# Patient Record
Sex: Female | Born: 1935 | Race: White | Hispanic: No | Smoking: Former smoker
Health system: Southern US, Community
[De-identification: ages and names within clinical notes are randomized; demographics above are authoritative.]

## PROBLEM LIST (undated history)

## (undated) DIAGNOSIS — I10 Essential (primary) hypertension: Secondary | ICD-10-CM

## (undated) DIAGNOSIS — C801 Malignant (primary) neoplasm, unspecified: Secondary | ICD-10-CM

## (undated) DIAGNOSIS — I2510 Atherosclerotic heart disease of native coronary artery without angina pectoris: Secondary | ICD-10-CM

## (undated) DIAGNOSIS — E079 Disorder of thyroid, unspecified: Secondary | ICD-10-CM

## (undated) DIAGNOSIS — J449 Chronic obstructive pulmonary disease, unspecified: Secondary | ICD-10-CM

## (undated) HISTORY — PX: BREAST SURGERY: SHX581

## (undated) HISTORY — PX: CHOLECYSTECTOMY: SHX55

## (undated) HISTORY — PX: MASTECTOMY: SHX3

## (undated) HISTORY — PX: HEMORRHOID SURGERY: SHX153

---

## 2004-10-29 ENCOUNTER — Ambulatory Visit: Payer: Self-pay | Admitting: Internal Medicine

## 2004-11-14 ENCOUNTER — Ambulatory Visit: Payer: Self-pay | Admitting: Internal Medicine

## 2004-11-21 ENCOUNTER — Ambulatory Visit: Payer: Self-pay | Admitting: Internal Medicine

## 2004-11-30 ENCOUNTER — Ambulatory Visit: Payer: Self-pay | Admitting: Internal Medicine

## 2004-12-11 ENCOUNTER — Ambulatory Visit: Payer: Self-pay | Admitting: General Surgery

## 2004-12-13 ENCOUNTER — Ambulatory Visit: Payer: Self-pay | Admitting: General Surgery

## 2004-12-15 ENCOUNTER — Ambulatory Visit: Payer: Self-pay | Admitting: Internal Medicine

## 2005-01-14 ENCOUNTER — Ambulatory Visit: Payer: Self-pay | Admitting: Internal Medicine

## 2005-02-14 ENCOUNTER — Ambulatory Visit: Payer: Self-pay | Admitting: Internal Medicine

## 2005-03-25 ENCOUNTER — Ambulatory Visit: Payer: Self-pay | Admitting: Internal Medicine

## 2005-04-16 ENCOUNTER — Ambulatory Visit: Payer: Self-pay | Admitting: Internal Medicine

## 2005-07-29 ENCOUNTER — Ambulatory Visit: Payer: Self-pay | Admitting: Internal Medicine

## 2005-08-16 ENCOUNTER — Ambulatory Visit: Payer: Self-pay | Admitting: Internal Medicine

## 2005-11-26 ENCOUNTER — Ambulatory Visit: Payer: Self-pay | Admitting: Internal Medicine

## 2005-12-09 ENCOUNTER — Ambulatory Visit: Payer: Self-pay | Admitting: General Surgery

## 2005-12-15 ENCOUNTER — Ambulatory Visit: Payer: Self-pay | Admitting: Internal Medicine

## 2006-06-20 ENCOUNTER — Ambulatory Visit: Payer: Self-pay | Admitting: General Surgery

## 2008-11-22 ENCOUNTER — Inpatient Hospital Stay: Payer: Self-pay | Admitting: Internal Medicine

## 2010-02-14 ENCOUNTER — Ambulatory Visit: Payer: Self-pay | Admitting: Internal Medicine

## 2010-02-28 ENCOUNTER — Ambulatory Visit: Payer: Self-pay | Admitting: Internal Medicine

## 2010-03-16 ENCOUNTER — Ambulatory Visit: Payer: Self-pay | Admitting: Internal Medicine

## 2014-02-01 ENCOUNTER — Emergency Department: Payer: Self-pay | Admitting: Emergency Medicine

## 2014-02-01 LAB — COMPREHENSIVE METABOLIC PANEL
ANION GAP: 5 — AB (ref 7–16)
Albumin: 3.4 g/dL (ref 3.4–5.0)
Alkaline Phosphatase: 68 U/L
BILIRUBIN TOTAL: 0.4 mg/dL (ref 0.2–1.0)
BUN: 28 mg/dL — AB (ref 7–18)
CO2: 28 mmol/L (ref 21–32)
CREATININE: 1.47 mg/dL — AB (ref 0.60–1.30)
Calcium, Total: 9.1 mg/dL (ref 8.5–10.1)
Chloride: 104 mmol/L (ref 98–107)
EGFR (Non-African Amer.): 30 — ABNORMAL LOW
GFR CALC AF AMER: 35 — AB
Glucose: 140 mg/dL — ABNORMAL HIGH (ref 65–99)
Osmolality: 282 (ref 275–301)
POTASSIUM: 3.9 mmol/L (ref 3.5–5.1)
SGOT(AST): 24 U/L (ref 15–37)
SGPT (ALT): 13 U/L (ref 12–78)
Sodium: 137 mmol/L (ref 136–145)
Total Protein: 6.8 g/dL (ref 6.4–8.2)

## 2014-02-01 LAB — CBC WITH DIFFERENTIAL/PLATELET
Basophil #: 0 10*3/uL (ref 0.0–0.1)
Basophil %: 0.5 %
Eosinophil #: 0.2 10*3/uL (ref 0.0–0.7)
Eosinophil %: 2.3 %
HCT: 42.2 % (ref 35.0–47.0)
HGB: 13.8 g/dL (ref 12.0–16.0)
LYMPHS ABS: 1.5 10*3/uL (ref 1.0–3.6)
LYMPHS PCT: 18.1 %
MCH: 31.3 pg (ref 26.0–34.0)
MCHC: 32.6 g/dL (ref 32.0–36.0)
MCV: 96 fL (ref 80–100)
Monocyte #: 1 x10 3/mm — ABNORMAL HIGH (ref 0.2–0.9)
Monocyte %: 11.8 %
NEUTROS ABS: 5.7 10*3/uL (ref 1.4–6.5)
Neutrophil %: 67.3 %
Platelet: 235 10*3/uL (ref 150–440)
RBC: 4.41 10*6/uL (ref 3.80–5.20)
RDW: 13.1 % (ref 11.5–14.5)
WBC: 8.5 10*3/uL (ref 3.6–11.0)

## 2014-02-01 LAB — URINALYSIS, COMPLETE
BACTERIA: NONE SEEN
Bilirubin,UR: NEGATIVE
Blood: NEGATIVE
GLUCOSE, UR: NEGATIVE mg/dL (ref 0–75)
Hyaline Cast: 7
Ketone: NEGATIVE
Nitrite: NEGATIVE
PROTEIN: NEGATIVE
Ph: 5 (ref 4.5–8.0)
RBC,UR: 2 /HPF (ref 0–5)
Specific Gravity: 1.016 (ref 1.003–1.030)
WBC UR: 5 /HPF (ref 0–5)

## 2014-02-01 LAB — TROPONIN I

## 2014-02-01 LAB — PRO B NATRIURETIC PEPTIDE: B-Type Natriuretic Peptide: 1258 pg/mL — ABNORMAL HIGH (ref 0–450)

## 2015-04-23 ENCOUNTER — Encounter: Payer: Self-pay | Admitting: Emergency Medicine

## 2015-04-23 ENCOUNTER — Other Ambulatory Visit: Payer: Self-pay

## 2015-04-23 ENCOUNTER — Emergency Department
Admission: EM | Admit: 2015-04-23 | Discharge: 2015-04-23 | Disposition: A | Discharge status: Skilled Nursing Facility | Payer: Medicare Other | Attending: Emergency Medicine | Admitting: Emergency Medicine

## 2015-04-23 ENCOUNTER — Emergency Department: Payer: Medicare Other

## 2015-04-23 DIAGNOSIS — I10 Essential (primary) hypertension: Secondary | ICD-10-CM | POA: Diagnosis not present

## 2015-04-23 DIAGNOSIS — J441 Chronic obstructive pulmonary disease with (acute) exacerbation: Secondary | ICD-10-CM | POA: Insufficient documentation

## 2015-04-23 DIAGNOSIS — Z87891 Personal history of nicotine dependence: Secondary | ICD-10-CM | POA: Diagnosis not present

## 2015-04-23 DIAGNOSIS — F419 Anxiety disorder, unspecified: Secondary | ICD-10-CM | POA: Diagnosis not present

## 2015-04-23 DIAGNOSIS — R0602 Shortness of breath: Secondary | ICD-10-CM | POA: Diagnosis present

## 2015-04-23 HISTORY — DX: Malignant (primary) neoplasm, unspecified: C80.1

## 2015-04-23 HISTORY — DX: Essential (primary) hypertension: I10

## 2015-04-23 HISTORY — DX: Atherosclerotic heart disease of native coronary artery without angina pectoris: I25.10

## 2015-04-23 HISTORY — DX: Disorder of thyroid, unspecified: E07.9

## 2015-04-23 HISTORY — DX: Chronic obstructive pulmonary disease, unspecified: J44.9

## 2015-04-23 LAB — COMPREHENSIVE METABOLIC PANEL
ALT: 13 U/L — ABNORMAL LOW (ref 14–54)
ANION GAP: 10 (ref 5–15)
AST: 23 U/L (ref 15–41)
Albumin: 4.3 g/dL (ref 3.5–5.0)
Alkaline Phosphatase: 74 U/L (ref 38–126)
BILIRUBIN TOTAL: 0.8 mg/dL (ref 0.3–1.2)
BUN: 38 mg/dL — AB (ref 6–20)
CO2: 30 mmol/L (ref 22–32)
Calcium: 9.7 mg/dL (ref 8.9–10.3)
Chloride: 99 mmol/L — ABNORMAL LOW (ref 101–111)
Creatinine, Ser: 1.26 mg/dL — ABNORMAL HIGH (ref 0.44–1.00)
GFR calc Af Amer: 41 mL/min — ABNORMAL LOW (ref >60)
GFR calc non Af Amer: 35 mL/min — ABNORMAL LOW (ref >60)
Glucose, Bld: 113 mg/dL — ABNORMAL HIGH (ref 65–99)
Potassium: 4.1 mmol/L (ref 3.5–5.1)
Sodium: 139 mmol/L (ref 135–145)
Total Protein: 7.2 g/dL (ref 6.5–8.1)

## 2015-04-23 LAB — CBC
HCT: 40.3 % (ref 35.0–47.0)
HEMOGLOBIN: 13.8 g/dL (ref 12.0–16.0)
MCH: 32.3 pg (ref 26.0–34.0)
MCHC: 34.2 g/dL (ref 32.0–36.0)
MCV: 94.4 fL (ref 80.0–100.0)
Platelets: 212 10*3/uL (ref 150–440)
RBC: 4.27 MIL/uL (ref 3.80–5.20)
RDW: 12.9 % (ref 11.5–14.5)
WBC: 7.3 10*3/uL (ref 3.6–11.0)

## 2015-04-23 LAB — TROPONIN I: Troponin I: <0.03 ng/mL (ref <0.031)

## 2015-04-23 MED ORDER — PREDNISONE 20 MG PO TABS: 20.0000 mg | ORAL_TABLET | Freq: Every day | ORAL | Status: AC

## 2015-04-23 MED ORDER — ALBUTEROL SULFATE (2.5 MG/3ML) 0.083% IN NEBU
2.5000 mg | INHALATION_SOLUTION | Freq: Once | RESPIRATORY_TRACT | Status: AC
  Administered 2015-04-23: 2.5 mg via RESPIRATORY_TRACT

## 2015-04-23 MED ORDER — ALBUTEROL SULFATE (2.5 MG/3ML) 0.083% IN NEBU
INHALATION_SOLUTION | RESPIRATORY_TRACT | Status: AC
  Administered 2015-04-23: 2.5 mg via RESPIRATORY_TRACT
  Filled 2015-04-23: qty 3

## 2015-04-23 MED ORDER — AZITHROMYCIN 250 MG PO TABS: ORAL_TABLET | ORAL | Status: AC

## 2015-04-23 NOTE — ED Provider Notes (Signed)
[facility] Emergency Department Provider Note  ____________________________________________  Time seen: On arrival, via EMS  I have reviewed the triage vital signs and the nursing notes.   HISTORY  Chief Complaint Shortness of Breath   HPI Lisa Fitzgerald is a 81 y.o. female who presents with shortness of breath. Patient reports she woke up this morning was admitted to catch her breath. She blames this on staff not giving her her Symbicort at scheduled times. She denies fevers chills. No chest pain although she does admit to some tightness. She denies cough. No recent travel. Given Solu-Medrol and DuoNeb by EMS and she reports she feels much better     No past medical history on file.  There are no active problems to display for this patient.   No past surgical history on file.  No current outpatient prescriptions on file.  Allergies Review of patient's allergies indicates not on file.  No family history on file.  Social History History  Substance Use Topics  . Smoking status: Not on file  . Smokeless tobacco: Not on file  . Alcohol Use: Not on file   no smoking, no alcohol   Review of Systems  Constitutional: Negative for fever. Eyes: Negative for discharge or redness ENT: Negative for sore throat Cardiovascular: Negative for chest pain. Respiratory: Positive for shortness of breath Gastrointestinal: Negative for abdominal pain, vomiting and diarrhea. Genitourinary: Negative for dysuria. Musculoskeletal: Negative for back pain. Skin: Negative for rash. Neurological: Negative for headaches or focal weakness Psychiatric: Positive for anxiety    ____________________________________________   PHYSICAL EXAM:  VITAL SIGNS: ED Triage Vitals  Enc Vitals Group     BP --      Pulse --      Resp --      Temp --      Temp src --      SpO2 --      Weight --      Height --      Head Cir --      Peak Flow --      Pain Score --    Pain Loc --      Pain Edu? --      Excl. in [place]? --      Constitutional: Alert and oriented. Well appearing for her age and in no distress. Eyes: Conjunctivae are normal.  ENT   Head: Normocephalic and atraumatic.   Mouth/Throat: Mucous membranes are moist. Cardiovascular: Normal rate, regular rhythm. Normal and symmetric distal pulses are present in all extremities. No murmurs, rubs, or gallops. Respiratory: Normal respiratory effort without tachypnea nor retractions. Breath sounds are clear and equal bilaterally. No wheezes heard Gastrointestinal: Soft and non-tender in all quadrants. No distention. There is no CVA tenderness. Genitourinary: deferred Musculoskeletal: Nontender with normal range of motion in all extremities. No lower extremity tenderness nor edema. Neurologic:  Normal speech and language. No gross focal neurologic deficits are appreciated. Skin:  Skin is warm, dry and intact. No rash noted. Psychiatric: Mood and affect are normal. Patient exhibits appropriate insight and judgment.  ____________________________________________    LABS (pertinent positives/negatives)  Labs Reviewed - No data to display  ____________________________________________   EKG ED ECG REPORT I, Lavonia Drafts, the attending physician, personally viewed and interpreted this ECG.   Date: 04/23/2015  EKG Time: 7:25 AM  Rate: 66  Rhythm: normal sinus rhythm  Axis: Normal axis  Intervals:none  ST&T Change: Q waves anteroseptally, no acute changes   ____________________________________________  RADIOLOGY I have personally reviewed any xrays that were ordered on this patient: No acute distress on chest x-ray  ____________________________________________   PROCEDURES  Procedure(s) performed: none  Critical Care performed: none  ____________________________________________   INITIAL IMPRESSION / ASSESSMENT AND PLAN / ED COURSE  Pertinent labs & imaging results  that were available during my care of the patient were reviewed by me and considered in my medical decision making (see chart for details).  Patient presents with likely bronchospasm which has improved significantly after DuoNeb administration. However she is still 94% on room air. We will give additional nebulizer treatments, do labs and x-rays and reevaluate   ----------------------------------------- 10:40 AM on 04/23/2015 ----------------------------------------- Patient well-appearing and comfortable. Heart rate normal at 99% on room air. She is talkative and cheerful. Her lungs sound clear. I feel she is okay for discharge with close follow-up by her nursing home staff. we will prescribe her prednisone for 5 days   ____________________________________________   FINAL CLINICAL IMPRESSION(S) / ED DIAGNOSES  Final diagnoses:  COPD exacerbation     Lavonia Drafts, MD 04/23/15 1041

## 2015-04-23 NOTE — ED Notes (Signed)
Ems from [facility] for shortness of breath. 1 duoneb given in route

## 2015-04-23 NOTE — Discharge Instructions (Signed)
Mrs. Bultman did well in the emergency department. Her oxygen levels were normal and she did not require any supplementation. We suspect she had an exacerbation of her COPD. Please watch her carefully and if she appears to have difficulty breathing and sent her back to the emergency department    Chronic Obstructive Pulmonary Disease Exacerbation Chronic obstructive pulmonary disease (COPD) is a common lung condition in which airflow from the lungs is limited. COPD is a general term that can be used to describe many different lung problems that limit airflow, including chronic bronchitis and emphysema. COPD exacerbations are episodes when breathing symptoms become much worse and require extra treatment. Without treatment, COPD exacerbations can be life threatening, and frequent COPD exacerbations can cause further damage to your lungs. CAUSES   Respiratory infections.   Exposure to smoke.   Exposure to air pollution, chemical fumes, or dust. Sometimes there is no apparent cause or trigger. RISK FACTORS  Smoking cigarettes.  Older age.  Frequent prior COPD exacerbations. SIGNS AND SYMPTOMS   Increased coughing.   Increased thick spit (sputum) production.   Increased wheezing.   Increased shortness of breath.   Rapid breathing.   Chest tightness. DIAGNOSIS  Your medical history, a physical exam, and tests will help your health care provider make a diagnosis. Tests may include:  A chest X-ray.  Basic lab tests.  Sputum testing.  An arterial blood gas test. TREATMENT  Depending on the severity of your COPD exacerbation, you may need to be admitted to a hospital for treatment. Some of the treatments commonly used to treat COPD exacerbations are:   Antibiotic medicines.   Bronchodilators. These are drugs that expand the air passages. They may be given with an inhaler or nebulizer. Spacer devices may be needed to help improve drug delivery.  Corticosteroid  medicines.  Supplemental oxygen therapy.  HOME CARE INSTRUCTIONS   Do not smoke. Quitting smoking is very important to prevent COPD from getting worse and exacerbations from happening as often.  Avoid exposure to all substances that irritate the airway, especially to tobacco smoke.   If you were prescribed an antibiotic medicine, finish it all even if you start to feel better.  Take all medicines as directed by your health care provider.It is important to use correct technique with inhaled medicines.  Drink enough fluids to keep your urine clear or pale yellow (unless you have a medical condition that requires fluid restriction).  Use a cool mist vaporizer. This makes it easier to clear your chest when you cough.   If you have a home nebulizer and oxygen, continue to use them as directed.   Maintain all necessary vaccinations to prevent infections.   Exercise regularly.   Eat a healthy diet.   Keep all follow-up appointments as directed by your health care provider. SEEK IMMEDIATE MEDICAL CARE IF:  You have worsening shortness of breath.   You have trouble talking.   You have severe chest pain.  You have blood in your sputum.  You have a fever.  You have weakness, vomit repeatedly, or faint.   You feel confused.   You continue to get worse. MAKE SURE YOU:   Understand these instructions.  Will watch your condition.  Will get help right away if you are not doing well or get worse. Document Released: 06/30/2007 Document Revised: 01/17/2014 Document Reviewed: 05/07/2013 [facility] Patient Information 2015 [place], [place]. This information is not intended to replace advice given to you by your health care provider. Make  sure you discuss any questions you have with your health care provider.

## 2015-04-23 NOTE — ED Notes (Addendum)
Called [facility] about transporting pt back. Christy @ [facility] said she would call family and then call me back.

## 2015-05-12 ENCOUNTER — Other Ambulatory Visit: Payer: Self-pay | Admitting: Emergency Medicine

## 2015-05-12 ENCOUNTER — Emergency Department: Payer: Medicare Other

## 2015-05-12 ENCOUNTER — Inpatient Hospital Stay
Admission: EM | Admit: 2015-05-12 | Discharge: 2015-05-18 | DRG: 377 | Disposition: A | Discharge status: Skilled Nursing Facility | Payer: Medicare Other | Attending: Internal Medicine | Admitting: Internal Medicine

## 2015-05-12 DIAGNOSIS — Z880 Allergy status to penicillin: Secondary | ICD-10-CM

## 2015-05-12 DIAGNOSIS — Z7982 Long term (current) use of aspirin: Secondary | ICD-10-CM | POA: Diagnosis not present

## 2015-05-12 DIAGNOSIS — K921 Melena: Secondary | ICD-10-CM

## 2015-05-12 DIAGNOSIS — Z9010 Acquired absence of unspecified breast and nipple: Secondary | ICD-10-CM | POA: Diagnosis present

## 2015-05-12 DIAGNOSIS — E039 Hypothyroidism, unspecified: Secondary | ICD-10-CM | POA: Diagnosis present

## 2015-05-12 DIAGNOSIS — Z6829 Body mass index (BMI) 29.0-29.9, adult: Secondary | ICD-10-CM | POA: Diagnosis not present

## 2015-05-12 DIAGNOSIS — J449 Chronic obstructive pulmonary disease, unspecified: Secondary | ICD-10-CM | POA: Diagnosis present

## 2015-05-12 DIAGNOSIS — R739 Hyperglycemia, unspecified: Secondary | ICD-10-CM | POA: Diagnosis present

## 2015-05-12 DIAGNOSIS — K922 Gastrointestinal hemorrhage, unspecified: Secondary | ICD-10-CM | POA: Diagnosis not present

## 2015-05-12 DIAGNOSIS — Z515 Encounter for palliative care: Secondary | ICD-10-CM

## 2015-05-12 DIAGNOSIS — I10 Essential (primary) hypertension: Secondary | ICD-10-CM | POA: Diagnosis present

## 2015-05-12 DIAGNOSIS — Z79899 Other long term (current) drug therapy: Secondary | ICD-10-CM

## 2015-05-12 DIAGNOSIS — K5733 Diverticulitis of large intestine without perforation or abscess with bleeding: Principal | ICD-10-CM | POA: Diagnosis present

## 2015-05-12 DIAGNOSIS — F0390 Unspecified dementia without behavioral disturbance: Secondary | ICD-10-CM | POA: Diagnosis present

## 2015-05-12 DIAGNOSIS — Z7951 Long term (current) use of inhaled steroids: Secondary | ICD-10-CM | POA: Diagnosis not present

## 2015-05-12 DIAGNOSIS — Z87891 Personal history of nicotine dependence: Secondary | ICD-10-CM

## 2015-05-12 DIAGNOSIS — K550 Acute vascular disorders of intestine: Secondary | ICD-10-CM | POA: Diagnosis present

## 2015-05-12 DIAGNOSIS — Z9049 Acquired absence of other specified parts of digestive tract: Secondary | ICD-10-CM | POA: Diagnosis present

## 2015-05-12 DIAGNOSIS — I25110 Atherosclerotic heart disease of native coronary artery with unstable angina pectoris: Secondary | ICD-10-CM | POA: Diagnosis present

## 2015-05-12 DIAGNOSIS — Z66 Do not resuscitate: Secondary | ICD-10-CM | POA: Diagnosis not present

## 2015-05-12 DIAGNOSIS — Z823 Family history of stroke: Secondary | ICD-10-CM

## 2015-05-12 DIAGNOSIS — I701 Atherosclerosis of renal artery: Secondary | ICD-10-CM | POA: Diagnosis present

## 2015-05-12 DIAGNOSIS — I714 Abdominal aortic aneurysm, without rupture, unspecified: Secondary | ICD-10-CM | POA: Insufficient documentation

## 2015-05-12 DIAGNOSIS — I70201 Unspecified atherosclerosis of native arteries of extremities, right leg: Secondary | ICD-10-CM | POA: Diagnosis present

## 2015-05-12 DIAGNOSIS — D62 Acute posthemorrhagic anemia: Secondary | ICD-10-CM | POA: Diagnosis present

## 2015-05-12 DIAGNOSIS — I739 Peripheral vascular disease, unspecified: Secondary | ICD-10-CM | POA: Diagnosis present

## 2015-05-12 DIAGNOSIS — K5791 Diverticulosis of intestine, part unspecified, without perforation or abscess with bleeding: Secondary | ICD-10-CM | POA: Diagnosis not present

## 2015-05-12 DIAGNOSIS — I959 Hypotension, unspecified: Secondary | ICD-10-CM | POA: Insufficient documentation

## 2015-05-12 LAB — TSH: TSH: 3.083 u[IU]/mL (ref 0.350–4.500)

## 2015-05-12 LAB — CBC WITH DIFFERENTIAL/PLATELET
Basophils Absolute: 0.2 10*3/uL — ABNORMAL HIGH (ref 0–0.1)
Basophils Relative: 2 %
EOS PCT: 2 %
Eosinophils Absolute: 0.2 10*3/uL (ref 0–0.7)
HEMATOCRIT: 33.4 % — AB (ref 35.0–47.0)
Hemoglobin: 11.2 g/dL — ABNORMAL LOW (ref 12.0–16.0)
LYMPHS ABS: 1.2 10*3/uL (ref 1.0–3.6)
LYMPHS PCT: 15 %
MCH: 31.8 pg (ref 26.0–34.0)
MCHC: 33.6 g/dL (ref 32.0–36.0)
MCV: 94.7 fL (ref 80.0–100.0)
MONO ABS: 0.8 10*3/uL (ref 0.2–0.9)
Monocytes Relative: 10 %
Neutro Abs: 5.8 10*3/uL (ref 1.4–6.5)
Neutrophils Relative %: 71 %
PLATELETS: 204 10*3/uL (ref 150–440)
RBC: 3.53 MIL/uL — ABNORMAL LOW (ref 3.80–5.20)
RDW: 12.8 % (ref 11.5–14.5)
WBC: 8.3 10*3/uL (ref 3.6–11.0)

## 2015-05-12 LAB — COMPREHENSIVE METABOLIC PANEL
ALK PHOS: 59 U/L (ref 38–126)
ALT: 11 U/L — ABNORMAL LOW (ref 14–54)
AST: 26 U/L (ref 15–41)
Albumin: 3.1 g/dL — ABNORMAL LOW (ref 3.5–5.0)
Anion gap: 9 (ref 5–15)
BUN: 30 mg/dL — AB (ref 6–20)
CALCIUM: 9.1 mg/dL (ref 8.9–10.3)
CO2: 24 mmol/L (ref 22–32)
Chloride: 103 mmol/L (ref 101–111)
Creatinine, Ser: 1.6 mg/dL — ABNORMAL HIGH (ref 0.44–1.00)
GFR calc non Af Amer: 26 mL/min — ABNORMAL LOW (ref >60)
GFR, EST AFRICAN AMERICAN: 30 mL/min — AB (ref >60)
Glucose, Bld: 143 mg/dL — ABNORMAL HIGH (ref 65–99)
Potassium: 4 mmol/L (ref 3.5–5.1)
Sodium: 136 mmol/L (ref 135–145)
Total Bilirubin: 0.5 mg/dL (ref 0.3–1.2)
Total Protein: 5.7 g/dL — ABNORMAL LOW (ref 6.5–8.1)

## 2015-05-12 LAB — PROTIME-INR
INR: 0.98
Prothrombin Time: 13.2 seconds (ref 11.4–15.0)

## 2015-05-12 LAB — HEMOGLOBIN
HEMOGLOBIN: 10.7 g/dL — AB (ref 12.0–16.0)
HEMOGLOBIN: 10.5 g/dL — AB (ref 12.0–16.0)

## 2015-05-12 LAB — MRSA PCR SCREENING: MRSA by PCR: NEGATIVE

## 2015-05-12 LAB — APTT: aPTT: 23 seconds — ABNORMAL LOW (ref 24–37)

## 2015-05-12 MED ORDER — METOPROLOL TARTRATE 25 MG PO TABS
25.0000 mg | ORAL_TABLET | Freq: Two times a day (BID) | ORAL | Status: DC
  Administered 2015-05-12 – 2015-05-13 (×2): 25 mg via ORAL
  Filled 2015-05-12 (×3): qty 1

## 2015-05-12 MED ORDER — MAGNESIUM OXIDE 400 (241.3 MG) MG PO TABS
400.0000 mg | ORAL_TABLET | Freq: Two times a day (BID) | ORAL | Status: DC
  Administered 2015-05-12 – 2015-05-18 (×13): 400 mg via ORAL
  Filled 2015-05-12 (×13): qty 1

## 2015-05-12 MED ORDER — OLMESARTAN MEDOXOMIL 20 MG PO TABS
20.0000 mg | ORAL_TABLET | Freq: Every day | ORAL | Status: DC
  Filled 2015-05-12: qty 1

## 2015-05-12 MED ORDER — HYDROCHLOROTHIAZIDE 12.5 MG PO CAPS: 12.5000 mg | ORAL_CAPSULE | Freq: Every day | ORAL | Status: DC

## 2015-05-12 MED ORDER — ONDANSETRON HCL 4 MG/2ML IJ SOLN
4.0000 mg | Freq: Four times a day (QID) | INTRAMUSCULAR | Status: DC | PRN
  Administered 2015-05-13 – 2015-05-16 (×5): 4 mg via INTRAVENOUS
  Filled 2015-05-12 (×6): qty 2

## 2015-05-12 MED ORDER — OLMESARTAN MEDOXOMIL-HCTZ 20-12.5 MG PO TABS: 1.0000 | ORAL_TABLET | Freq: Every day | ORAL | Status: DC

## 2015-05-12 MED ORDER — BUDESONIDE-FORMOTEROL FUMARATE 160-4.5 MCG/ACT IN AERO
2.0000 | INHALATION_SPRAY | Freq: Two times a day (BID) | RESPIRATORY_TRACT | Status: DC
  Administered 2015-05-12 – 2015-05-18 (×13): 2 via RESPIRATORY_TRACT
  Filled 2015-05-12: qty 6

## 2015-05-12 MED ORDER — SODIUM CHLORIDE 0.9 % IV BOLUS (SEPSIS)
500.0000 mL | Freq: Once | INTRAVENOUS | Status: AC
  Administered 2015-05-12: 500 mL via INTRAVENOUS

## 2015-05-12 MED ORDER — SODIUM CHLORIDE 0.9 % IV SOLN
INTRAVENOUS | Status: DC
  Administered 2015-05-12 – 2015-05-15 (×4): via INTRAVENOUS
  Administered 2015-05-15: 130 mL/h via INTRAVENOUS
  Administered 2015-05-16: 06:00:00 via INTRAVENOUS

## 2015-05-12 MED ORDER — ONDANSETRON HCL 4 MG PO TABS: 4.0000 mg | ORAL_TABLET | Freq: Four times a day (QID) | ORAL | Status: DC | PRN

## 2015-05-12 MED ORDER — LEVOTHYROXINE SODIUM 50 MCG PO TABS
50.0000 ug | ORAL_TABLET | Freq: Every day | ORAL | Status: DC
  Administered 2015-05-12 – 2015-05-18 (×7): 50 ug via ORAL
  Filled 2015-05-12 (×7): qty 1

## 2015-05-12 NOTE — ED Notes (Signed)
MD at bedside. 

## 2015-05-12 NOTE — H&P (Addendum)
Lisa Fitzgerald is an 81 y.o. female.   Chief Complaint: Bloody stool HPI: The patient presents emergency department via EMS from her assisted living facility following 3 bloody nonpainful stools. Patient states that she felt an urgency to have a bowel movement. When she sat on the commode she felt an extreme pressure in her rectum and had a copious amount of bloody stool after which she felt lightheaded. She also admits to nausea but denies chest pain or shortness of breath. The patient had 2 more episodes with similar associated symptoms which prompted her to come to the hospital for evaluation. In the emergency department she was found to have 30 and he colored stools which prompted the ED staff to call for admission.  Past Medical History  Diagnosis Date  . Hypertension   . COPD (chronic obstructive pulmonary disease)   . Coronary artery disease   . Thyroid disease   . Cancer     Past Surgical History  Procedure Laterality Date  . Breast surgery    . Cholecystectomy    . Mastectomy    . Mastectomy      30 years ago  . Hemorrhoid surgery      Family History  Problem Relation Age of Onset  . Stroke Mother   . Stroke Father    Social History:  reports that she has quit smoking. She does not have any smokeless tobacco history on file. She reports that she does not drink alcohol or use illicit drugs.  Allergies:  Allergies  Allergen Reactions  . Penicillins     Medications Prior to Admission  Medication Sig Dispense Refill  . aspirin EC 81 MG tablet Take 81 mg by mouth daily.    . budesonide-formoterol (SYMBICORT) 160-4.5 MCG/ACT inhaler Inhale 2 puffs into the lungs 2 (two) times daily.    Marland Kitchen levothyroxine (SYNTHROID, LEVOTHROID) 50 MCG tablet Take 50 mcg by mouth daily before breakfast.    . magnesium oxide (MAG-OX) 400 MG tablet Take 400 mg by mouth 2 (two) times daily.    . metoprolol tartrate (LOPRESSOR) 25 MG tablet Take 25 mg by mouth 2 (two) times daily.    Marland Kitchen  olmesartan-hydrochlorothiazide (BENICAR HCT) 20-12.5 MG per tablet Take 1 tablet by mouth daily.      Results for orders placed or performed during the hospital encounter of 05/12/15 (from the past 48 hour(s))  CBC with Differential/Platelet     Status: Abnormal   Collection Time: 05/12/15  1:59 AM  Result Value Ref Range   WBC 8.3 3.6 - 11.0 K/uL   RBC 3.53 (L) 3.80 - 5.20 MIL/uL   Hemoglobin 11.2 (L) 12.0 - 16.0 g/dL   HCT 33.4 (L) 35.0 - 47.0 %   MCV 94.7 80.0 - 100.0 fL   MCH 31.8 26.0 - 34.0 pg   MCHC 33.6 32.0 - 36.0 g/dL   RDW 12.8 11.5 - 14.5 %   Platelets 204 150 - 440 K/uL   Neutrophils Relative % 71 %   Neutro Abs 5.8 1.4 - 6.5 K/uL   Lymphocytes Relative 15 %   Lymphs Abs 1.2 1.0 - 3.6 K/uL   Monocytes Relative 10 %   Monocytes Absolute 0.8 0.2 - 0.9 K/uL   Eosinophils Relative 2 %   Eosinophils Absolute 0.2 0 - 0.7 K/uL   Basophils Relative 2 %   Basophils Absolute 0.2 (H) 0 - 0.1 K/uL  APTT     Status: Abnormal   Collection Time: 05/12/15  1:59 AM  Result Value Ref Range   aPTT 23 (L) 24 - 37 seconds    Comment: REPEATED, CHECKED FOR CLOT  Protime-INR     Status: None   Collection Time: 05/12/15  1:59 AM  Result Value Ref Range   Prothrombin Time 13.2 11.4 - 15.0 seconds   INR 0.98   Comprehensive metabolic panel     Status: Abnormal   Collection Time: 05/12/15  1:59 AM  Result Value Ref Range   Sodium 136 135 - 145 mmol/L   Potassium 4.0 3.5 - 5.1 mmol/L   Chloride 103 101 - 111 mmol/L   CO2 24 22 - 32 mmol/L   Glucose, Bld 143 (H) 65 - 99 mg/dL   BUN 30 (H) 6 - 20 mg/dL   Creatinine, Ser 1.60 (H) 0.44 - 1.00 mg/dL   Calcium 9.1 8.9 - 10.3 mg/dL   Total Protein 5.7 (L) 6.5 - 8.1 g/dL   Albumin 3.1 (L) 3.5 - 5.0 g/dL   AST 26 15 - 41 U/L   ALT 11 (L) 14 - 54 U/L   Alkaline Phosphatase 59 38 - 126 U/L   Total Bilirubin 0.5 0.3 - 1.2 mg/dL   GFR calc non Af Amer 26 (L) >60 mL/min   GFR calc Af Amer 30 (L) >60 mL/min    Comment: (NOTE) The eGFR has  been calculated using the CKD EPI equation. This calculation has not been validated in all clinical situations. eGFR's persistently <60 mL/min signify possible Chronic Kidney Disease.    Anion gap 9 5 - 15  Type and screen     Status: None   Collection Time: 05/12/15  1:59 AM  Result Value Ref Range   ABO/RH(D) A POS    Antibody Screen NEG    Sample Expiration 05/15/2015    Dg Chest Port 1 View  05/12/2015   CLINICAL DATA:  Weakness and bright red bloody stools. History of breast cancer with mastectomy.  EXAM: PORTABLE CHEST - 1 VIEW  COMPARISON:  04/23/2015  FINDINGS: Borderline heart size without vascular congestion. Emphysematous changes in the lungs. Central interstitial changes suggest chronic bronchitis. No focal consolidation or airspace disease. No blunting of costophrenic angles. No pneumothorax. Surgical clips in the right axilla. Calcification of the aorta.  IMPRESSION: Emphysematous changes in the lungs with central chronic bronchitic change. No evidence of active pulmonary disease.   Electronically Signed   By: Lucienne Capers M.D.   On: 05/12/2015 03:40    Review of Systems  Constitutional: Negative for fever and chills.  HENT: Negative for sore throat and tinnitus.   Eyes: Negative for blurred vision and redness.  Respiratory: Negative for cough and shortness of breath.   Cardiovascular: Negative for chest pain, palpitations, orthopnea and PND.  Gastrointestinal: Positive for nausea. Negative for vomiting, abdominal pain and diarrhea.  Genitourinary: Negative for dysuria, urgency and frequency.  Musculoskeletal: Negative for myalgias and joint pain.  Skin: Negative for rash.       No lesions  Neurological: Negative for speech change, focal weakness and weakness.       Lightheadedness  Endo/Heme/Allergies: Does not bruise/bleed easily.       No temperature intolerance  Psychiatric/Behavioral: Negative for depression and suicidal ideas.    Blood pressure 114/45, pulse  71, temperature 98 F (36.7 C), temperature source Oral, resp. rate 14, height $RemoveBe'5\' 5"'kwaYSmLPb$  (1.651 m), weight 81.511 kg (179 lb 11.2 oz), SpO2 98 %. Physical Exam  Vitals reviewed. Constitutional: She is oriented to person, place, and  time. She appears well-developed and well-nourished. No distress.  HENT:  Head: Normocephalic and atraumatic.  Mouth/Throat: Oropharynx is clear and moist.  Eyes: EOM are normal. Pupils are equal, round, and reactive to light. No scleral icterus.  Neck: Normal range of motion. Neck supple. No JVD present. No tracheal deviation present. No thyromegaly present.  Cardiovascular: Normal rate, regular rhythm and normal heart sounds.  Exam reveals no gallop and no friction rub.   No murmur heard. Respiratory: Effort normal and breath sounds normal.  GI: Soft. Bowel sounds are normal. She exhibits distension (Left side more than right). There is no tenderness.  Genitourinary:  Deferred  Musculoskeletal: Normal range of motion. She exhibits no edema.  Lymphadenopathy:    She has no cervical adenopathy.  Neurological: She is alert and oriented to person, place, and time. No cranial nerve deficit. She exhibits normal muscle tone.  Skin: Skin is warm and dry. No rash noted. No erythema.  Psychiatric: She has a normal mood and affect. Her behavior is normal. Judgment and thought content normal.     Assessment/Plan This is a 81 year old Caucasian female admitted for GI bleeding. 1. GI bleed: Likely lower GI bleed roughly secondary to diverticulitis. The patient's left upper and lower quadrant is mildly distended and vaguely tender without rebound or guarding. Her hemoglobin and hematocrit are stable for now. Initially the patient's blood pressure was slightly soft but has improved with fluid. She is hemodynamically stable. Gastroenterology consult ordered. Hold aspirin 2. Hypertension: Continue hydrochlorothiazide, Benicar and metoprolol 3. Hypothyroidism: Continue  Synthroid 4. DVT prophylaxis: SCDs 5. GI prophylaxis: None The patient is a full code. Time spent on admission orders and patient care approximately 35 minutes  Harrie Foreman 05/12/2015, 6:45 AM

## 2015-05-12 NOTE — Evaluation (Signed)
Physical Therapy Evaluation Patient Details Name: Lisa Fitzgerald MRN: [id number] DOB: [birth year] Today's Date: 05/12/2015   History of Present Illness  Pt is a 81 y.o. female presenting to hospital with 3 episodes of bloody stool and admitted with lower GI bleed.  Clinical Impression  Currently pt demonstrates impairments with strength, endurance, and limitations with functional mobility.  Prior to admission, pt was living at ALF and independent with functional mobility using rollator (had assist for meals, cleaning, and showering).  Currently pt is CGA with transfers and ambulation with rollator.  Pt encouraged to ambulate with staff to prevent deconditioning during hospital stay to ensure pt continues to be safe with mobility for eventual discharge back to ALF.  Pt would benefit from skilled PT to address above noted impairments and functional limitations.  Recommend pt discharge to ALF with HHPT when medically appropriate.     Follow Up Recommendations Home health PT;Other (comment) (Prior level of assist at ALF)    Equipment Recommendations       Recommendations for Other Services       Precautions / Restrictions Precautions Precautions: Fall Restrictions Weight Bearing Restrictions: No      Mobility  Bed Mobility Overal bed mobility: Needs Assistance Bed Mobility: Supine to Sit;Sit to Supine     Supine to sit: Supervision;HOB elevated Sit to supine: Supervision;HOB elevated      Transfers Overall transfer level: Needs assistance Equipment used: 4-wheeled walker Transfers: Sit to/from Stand Sit to Stand: Min guard         General transfer comment: steady without loss of balance  Ambulation/Gait Ambulation/Gait assistance: Min guard Ambulation Distance (Feet): 40 Feet Assistive device: 4-wheeled walker       General Gait Details: pt initially with decreased cadence and increased effort taking steps but improved to more of a normal gait pattern with  distance  Stairs            Wheelchair Mobility    Modified Rankin (Stroke Patients Only)       Balance Overall balance assessment: Needs assistance Sitting-balance support: No upper extremity supported;Feet supported Sitting balance-Leahy Scale: Good     Standing balance support: Bilateral upper extremity supported;During functional activity Standing balance-Leahy Scale: Good                               Pertinent Vitals/Pain Pain Assessment: No/denies pain  See flowsheet for O2 and HR vitals (WFL).    Home Living Family/patient expects to be discharged to:: Assisted living   Available Help at Discharge:  (assist for meals and cleaning room) Type of Home: Assisted living [facility]) Home Access: Level entry     Home Layout: One level Home Equipment: Mildred - 4 wheels;Shower seat      Prior Function Level of Independence: Needs assistance   Gait / Transfers Assistance Needed: Independent with rollator  ADL's / Homemaking Assistance Needed: Assist for meals and cleaning her room; also assist for showers (otherwise pt does sponge baths on own)        Hand Dominance        Extremity/Trunk Assessment   Upper Extremity Assessment: Generalized weakness           Lower Extremity Assessment: Generalized weakness         Communication   Communication: No difficulties  Cognition Arousal/Alertness: Awake/alert Behavior During Therapy: WFL for tasks assessed/performed Overall Cognitive Status: Within Functional Limits for tasks assessed  General Comments   Nursing cleared pt for participation in physical therapy.  Pt agreeable to PT session. Pt's daughter present during session.    Exercises        Assessment/Plan    PT Assessment Patient needs continued PT services  PT Diagnosis Generalized weakness;Difficulty walking   PT Problem List Decreased strength;Decreased activity tolerance;Decreased  balance;Decreased mobility  PT Treatment Interventions DME instruction;Gait training;Functional mobility training;Therapeutic activities;Therapeutic exercise;Balance training;Patient/family education   PT Goals (Current goals can be found in the Care Plan section) Acute Rehab PT Goals Patient Stated Goal: To go home PT Goal Formulation: With patient Time For Goal Achievement: 05/26/15 Potential to Achieve Goals: Good    Frequency Min 2X/week   Barriers to discharge        Co-evaluation               End of Session Equipment Utilized During Treatment: Gait belt Activity Tolerance: Patient limited by fatigue Patient left: in bed;with call bell/phone within reach;with bed alarm set;with family/visitor present;with SCD's reapplied           Time: 4680-3212 PT Time Calculation (min) (ACUTE ONLY): 28 min   Charges:   PT Evaluation $Initial PT Evaluation Tier I: 1 Procedure     PT G CodesLeitha Bleak 05/15/15, 3:39 PM [name], [place]

## 2015-05-12 NOTE — ED Provider Notes (Signed)
[facility] Emergency Department Provider Note  ____________________________________________  Time seen: Approximately 3:03 AM  I have reviewed the triage vital signs and the nursing notes.   HISTORY  Chief Complaint GI Bleeding    HPI Glendale Dusenbery is a 81 y.o. female who presents to the ED via EMS from nursing facility for a chief complaint of rectal bleeding. Patient denies history of same. Reports tonight has had 3 bloody bowel movements associated with lower abdominal cramping. Takes a baby aspirin daily. Recently treated with prednisone and Z-Pak for bronchitis 2 weeks ago. Denies fever, chills, chest pain, shortness of breath, vomiting, diarrhea. Does report nausea with diaphoresis during most recent episode of bloody stool.   Past Medical History  Diagnosis Date  . Hypertension   . COPD (chronic obstructive pulmonary disease)   . Coronary artery disease   . Thyroid disease   . Cancer     There are no active problems to display for this patient.   Past Surgical History  Procedure Laterality Date  . Breast surgery    . Cholecystectomy    . Mastectomy    . Mastectomy      30 years ago  . Hemorrhoid surgery      Current Outpatient Rx  Name  Route  Sig  Dispense  Refill  . aspirin EC 81 MG tablet   Oral   Take 81 mg by mouth daily.         . budesonide-formoterol (SYMBICORT) 160-4.5 MCG/ACT inhaler   Inhalation   Inhale 2 puffs into the lungs 2 (two) times daily.         Marland Kitchen levothyroxine (SYNTHROID, LEVOTHROID) 50 MCG tablet   Oral   Take 50 mcg by mouth daily before breakfast.         . magnesium oxide (MAG-OX) 400 MG tablet   Oral   Take 400 mg by mouth 2 (two) times daily.         . metoprolol tartrate (LOPRESSOR) 25 MG tablet   Oral   Take 25 mg by mouth 2 (two) times daily.         Marland Kitchen olmesartan-hydrochlorothiazide (BENICAR HCT) 20-12.5 MG per tablet   Oral   Take 1 tablet by mouth daily.            Allergies Penicillins  Family History  Problem Relation Age of Onset  . Stroke Mother   . Stroke Father     Social History Social History  Substance Use Topics  . Smoking status: Former Research scientist (life sciences)  . Smokeless tobacco: None  . Alcohol Use: No    Review of Systems Constitutional: No fever/chills Eyes: No visual changes. ENT: No sore throat. Cardiovascular: Denies chest pain. Respiratory: Denies shortness of breath. Gastrointestinal: No abdominal pain.  Positive for nausea, no vomiting.  No diarrhea.  No constipation. Positive for bloody stools. Genitourinary: Negative for dysuria. Musculoskeletal: Negative for back pain. Skin: Negative for rash. Neurological: Negative for headaches, focal weakness or numbness.  10-point ROS otherwise negative.  ____________________________________________   PHYSICAL EXAM:  VITAL SIGNS: ED Triage Vitals  Enc Vitals Group     BP 05/12/15 0152 122/63 mmHg     Pulse Rate 05/12/15 0152 67     Resp 05/12/15 0152 23     Temp --      Temp src --      SpO2 05/12/15 0149 97 %     Weight 05/12/15 0152 183 lb (83.008 kg)     Height 05/12/15  4259 5\' 5"  (1.651 m)     Head Cir --      Peak Flow --      Pain Score --      Pain Loc --      Pain Edu? --      Excl. in [place]? --     Constitutional: Alert and oriented. Well appearing and in mild acute distress. Eyes: Conjunctivae are normal. PERRL. EOMI. Head: Atraumatic. Nose: No congestion/rhinnorhea. Mouth/Throat: Mucous membranes are moist.  Oropharynx non-erythematous. Neck: No stridor.   Cardiovascular: Normal rate, regular rhythm. III/VI SEM.  Good peripheral circulation. Respiratory: Normal respiratory effort.  No retractions. Lungs CTAB. Gastrointestinal: Soft and nontender. No distention. No abdominal bruits. No CVA tenderness. Musculoskeletal: No lower extremity tenderness nor edema.  No joint effusions. Neurologic:  Normal speech and language. No gross focal neurologic deficits  are appreciated.  Skin:  Skin is warm, dry and intact. No rash noted. Psychiatric: Mood and affect are normal. Speech and behavior are normal.  ____________________________________________   LABS (all labs ordered are listed, but only abnormal results are displayed)  Labs Reviewed  CBC WITH DIFFERENTIAL/PLATELET - Abnormal; Notable for the following:    RBC 3.53 (*)    Hemoglobin 11.2 (*)    HCT 33.4 (*)    Basophils Absolute 0.2 (*)    All other components within normal limits  APTT - Abnormal; Notable for the following:    aPTT 23 (*)    All other components within normal limits  COMPREHENSIVE METABOLIC PANEL - Abnormal; Notable for the following:    Glucose, Bld 143 (*)    BUN 30 (*)    Creatinine, Ser 1.60 (*)    Total Protein 5.7 (*)    Albumin 3.1 (*)    ALT 11 (*)    GFR calc non Af Amer 26 (*)    GFR calc Af Amer 30 (*)    All other components within normal limits  PROTIME-INR  TYPE AND SCREEN   ____________________________________________  EKG  ED ECG REPORT I, Luvinia Lucy J, the attending physician, personally viewed and interpreted this ECG.   Date: 05/12/2015  EKG Time: 0350  Rate: 67  Rhythm: normal EKG, normal sinus rhythm  Axis: Normal  Intervals: Occasional PVCs  ST&T Change: Nonspecific  ____________________________________________  RADIOLOGY  Portable chest xray (viewed by me, interpreted per Dr. Gerilyn Nestle): Emphysematous changes in the lungs with central chronic bronchitic change. No evidence of active pulmonary disease. ____________________________________________   PROCEDURES  Procedure(s) performed:   Rectal exam: Gross hematochezia visualized on exam.   Critical Care performed: Yes, see critical care note(s)   CRITICAL CARE Performed by: Paulette Blanch   Total critical care time: 30 minutes  Critical care time was exclusive of separately billable procedures and treating other patients.  Critical care was necessary to treat or  prevent imminent or life-threatening deterioration.  Critical care was time spent personally by me on the following activities: development of treatment plan with patient and/or surrogate as well as nursing, discussions with consultants, evaluation of patient's response to treatment, examination of patient, obtaining history from patient or surrogate, ordering and performing treatments and interventions, ordering and review of laboratory studies, ordering and review of radiographic studies, pulse oximetry and re-evaluation of patient's condition.   ____________________________________________   INITIAL IMPRESSION / ASSESSMENT AND PLAN / ED COURSE  Pertinent labs & imaging results that were available during my care of the patient were reviewed by me and considered in my medical decision making (  see chart for details).  81 year old female who presents with hematochezia and hypotension; suspect lower GI bleed. Blood pressure currently 686 systolic with IV fluids. No transfusion need at this time based on initial H/H, but patient is typed and screened. Other labs notable for increased BUN and creatinine. Will continue IV fluids and discuss with hospitalist for evaluation in the ED for admission.  ____________________________________________   FINAL CLINICAL IMPRESSION(S) / ED DIAGNOSES  Final diagnoses:  Lower GI bleed  Hematochezia  Hypotension, unspecified hypotension type      Paulette Blanch, MD 05/12/15 [phone]

## 2015-05-12 NOTE — ED Notes (Signed)
Bed ready, awaiting admit orders from [facility].

## 2015-05-12 NOTE — ED Notes (Signed)
MD made aware of low b/p, bolus ordered.

## 2015-05-12 NOTE — ED Notes (Signed)
Pt BIB EMS with c/o 3 bright red bloody stools, began around midnight.

## 2015-05-12 NOTE — ED Notes (Signed)
Pt stated she needs to void, pt placed on bedpan.

## 2015-05-12 NOTE — Consult Note (Signed)
GI Inpatient Consult Note  Reason for Consult: GI bleed   Attending Requesting Consult: Marcille Blanco  History of Present Illness: Lisa Fitzgerald is a 81 y.o. female with a h/o HTN, COPD, CAD, and hypothyroidism admitted for a GI bleed.  She presented to the [facility] ED with rectal bleeding, reporting 3 episodes of hematochezia with lower abdominal cramping and diarphoresis.  Labs revealed Hgb 11.2, which is down from 13.8 at last check 2 weeks ago.  She was hemodynamically stable with BP around 578/46, diastolic persistently low per recordings from previous visits.  She was started on IV fluids for BP control and admitted for further evaluation and management.  Today, [name] reports she is seeing symptomatic improvement.  She states she has had two BMs so far today with about 1/4 the amount of blood she saw yesterday.  She experienced urgency and significant rectal pressure prior to BM yesterday, but has not felt these today.  She also noted feeling lightheaded, sweaty, and nauseous yesterday, which have also resolved.  Patient presently denies abdominal cramping, nausea, vomiting, hematochezia, and melena.  She does report taking azithromycin 2 weeks for bronchitis, otherwise no NSAIDs, EtOH, or tobacco use.  She denies alarm symptoms including fever, chills, weight loss, h/o anemia, and previous GI bleed.  Also no dysphagia or reflux.   Of note, Hgb decreased to 10.7 this morning.  Last endoscopy: none Last colonoscopy: > 20 years ago  Past Medical History:  Past Medical History  Diagnosis Date  . Hypertension   . COPD (chronic obstructive pulmonary disease)   . Coronary artery disease   . Thyroid disease   . Cancer     Problem List: Patient Active Problem List   Diagnosis Date Noted  . GI bleed 05/12/2015    Past Surgical History: Past Surgical History  Procedure Laterality Date  . Breast surgery    . Cholecystectomy    . Mastectomy    . Mastectomy      30 years ago  . Hemorrhoid  surgery      Allergies: Allergies  Allergen Reactions  . Penicillins     Home Medications: Prescriptions prior to admission  Medication Sig Dispense Refill Last Dose  . aspirin EC 81 MG tablet Take 81 mg by mouth daily.   05/11/2015 at 0800  . budesonide-formoterol (SYMBICORT) 160-4.5 MCG/ACT inhaler Inhale 2 puffs into the lungs 2 (two) times daily.   Past Month at Unknown time  . levothyroxine (SYNTHROID, LEVOTHROID) 50 MCG tablet Take 50 mcg by mouth daily before breakfast.   05/11/2015 at 0700  . magnesium oxide (MAG-OX) 400 MG tablet Take 400 mg by mouth 2 (two) times daily.   05/11/2015 at 2000  . metoprolol tartrate (LOPRESSOR) 25 MG tablet Take 25 mg by mouth 2 (two) times daily.   05/11/2015 at 2000  . olmesartan-hydrochlorothiazide (BENICAR HCT) 20-12.5 MG per tablet Take 1 tablet by mouth daily.   05/11/2015 at 0800   Home medication reconciliation was completed with the patient.   Scheduled Inpatient Medications:   . budesonide-formoterol  2 puff Inhalation BID  . levothyroxine  50 mcg Oral QAC breakfast  . magnesium oxide  400 mg Oral BID  . metoprolol tartrate  25 mg Oral BID    Continuous Inpatient Infusions:   . sodium chloride      PRN Inpatient Medications:  ondansetron **OR** ondansetron (ZOFRAN) IV  Family History: family history includes Stroke in her father and mother.  The patient's family history is negative for  inflammatory bowel disorders, GI malignancy, or solid organ transplantation.  Social History:   reports that she has quit smoking. She does not have any smokeless tobacco history on file. She reports that she does not drink alcohol or use illicit drugs. The patient denies ETOH, tobacco, or drug use.   Review of Systems: Constitutional: Weight is stable.  Eyes: No changes in vision. ENT: No oral lesions, sore throat.  GI: see HPI.  Heme/Lymph: No easy bruising.  CV: No chest pain.  GU: No hematuria.  Integumentary: No rashes.  Neuro: No  headaches.  Psych: No depression/anxiety.  Endocrine: No heat/cold intolerance.  Allergic/Immunologic: No urticaria.  Resp: No cough, SOB.  Musculoskeletal: No joint swelling.    Physical Examination: BP 119/46 mmHg  Pulse 76  Temp(Src) 98.2 F (36.8 C) (Oral)  Resp 16  Ht 5\' 5"  (1.651 m)  Wt 81.511 kg (179 lb 11.2 oz)  BMI 29.90 kg/m2  SpO2 97% Gen: NAD, alert and oriented x 4 HEENT: PEERLA, EOMI, Neck: supple, no JVD or thyromegaly Chest: CTA bilaterally, no wheezes, crackles, or other adventitious sounds CV: RRR, + systolic ejection murmur, no g/c/r Abd: soft, NT, ND, +BS in all four quadrants; no HSM, guarding, ridigity, or rebound tenderness Ext: no edema, well perfused with 2+ pulses, Skin: no rash or lesions noted Lymph: no LAD  Data: Lab Results  Component Value Date   WBC 8.3 05/12/2015   HGB 10.7* 05/12/2015   HCT 33.4* 05/12/2015   MCV 94.7 05/12/2015   PLT 204 05/12/2015    Recent Labs Lab 05/12/15 0159 05/12/15 1036  HGB 11.2* 10.7*   Lab Results  Component Value Date   NA 136 05/12/2015   K 4.0 05/12/2015   CL 103 05/12/2015   CO2 24 05/12/2015   BUN 30* 05/12/2015   CREATININE 1.60* 05/12/2015   Lab Results  Component Value Date   ALT 11* 05/12/2015   AST 26 05/12/2015   ALKPHOS 59 05/12/2015   BILITOT 0.5 05/12/2015    Recent Labs Lab 05/12/15 0159  APTT 23*  INR 0.98   Assessment/Plan: [name] is a 81 y.o. female with a h/o HTN, COPD, CAD, and hypothyroidism admitted for a GI bleed.  Patient reports only two BMs today with decreased amount of BRBPR each time.  Also no abdominal cramping, nausea, lightheadedness, or diaphoresis.  Her improvement today suggest this is likely a diverticular bleed.  We will continue to monitor her Hgb and hemodynamics, and will consider a tag scan if significant bleeding returns.   Recommendations:  1. GI bleed - Continue to monitor Hgb, transfuse if Hgb <7 - Consider tag scan if significant  bleeding continues  Thank you for the consult. We will follow along with you. Please call with questions or concerns.  Lavera Guise, PA-C [facility] Phone: [phone] Pager: [phone]

## 2015-05-12 NOTE — ED Notes (Signed)
Family at bedside. 

## 2015-05-12 NOTE — Progress Notes (Signed)
GI Note:  Agree with Sharyn Lull Johnson's a/p.    Bleeding resovling in this 81 y/o.  Recommend tagged scan for further heavy bleeding.  Otherwise d/c if hgb stable and no further bleeding.

## 2015-05-12 NOTE — ED Notes (Signed)
Patient is resting comfortably. 

## 2015-05-12 NOTE — Progress Notes (Signed)
Patient ID: Lisa Fitzgerald, female   DOB: 1918-04-14, 81 y.o.   MRN: [id number] [facility] PROGRESS NOTE  PCP: No primary care provider on file.  HPI/Subjective: Patient had 3 episodes of bleeding into the commode. She states that she felt some cramping and then a gush of blood. She quantifies at least a cup full each time.  Objective: Filed Vitals:   05/12/15 0827  BP: 119/46  Pulse: 76  Temp: 98.2 F (36.8 C)  Resp: 16   No intake or output data in the 24 hours ending 05/12/15 1011 Filed Weights   05/12/15 0152 05/12/15 0605  Weight: 83.008 kg (183 lb) 81.511 kg (179 lb 11.2 oz)    ROS: Review of Systems  Constitutional: Negative for fever and chills.  Eyes: Negative for blurred vision.  Respiratory: Negative for cough and shortness of breath.   Cardiovascular: Negative for chest pain.  Gastrointestinal: Positive for abdominal pain, diarrhea and blood in stool. Negative for nausea, vomiting and constipation.  Genitourinary: Negative for dysuria.  Musculoskeletal: Negative for joint pain.  Neurological: Negative for dizziness and headaches.   Exam: Physical Exam  Constitutional: She is oriented to person, place, and time.  HENT:  Nose: No mucosal edema.  Mouth/Throat: No oropharyngeal exudate or posterior oropharyngeal edema.  Eyes: Conjunctivae, EOM and lids are normal. Pupils are equal, round, and reactive to light.  Neck: No JVD present. Carotid bruit is not present. No edema present. No thyroid mass and no thyromegaly present.  Cardiovascular: S1 normal and S2 normal.  Exam reveals no gallop.   Murmur heard.  Systolic murmur is present with a grade of 4/6  Pulses:      Dorsalis pedis pulses are 2+ on the right side, and 2+ on the left side.  Respiratory: No respiratory distress. She has no wheezes. She has no rhonchi. She has no rales.  GI: Soft. Bowel sounds are normal. There is no tenderness.  Musculoskeletal:       Right ankle: She exhibits no  swelling.       Left ankle: She exhibits no swelling.  Lymphadenopathy:    She has no cervical adenopathy.  Neurological: She is alert and oriented to person, place, and time. No cranial nerve deficit.  Skin: Skin is warm. No rash noted. Nails show no clubbing.  Psychiatric: She has a normal mood and affect.    Data Reviewed: Basic Metabolic Panel:  Recent Labs Lab 05/12/15 0159  NA 136  K 4.0  CL 103  CO2 24  GLUCOSE 143*  BUN 30*  CREATININE 1.60*  CALCIUM 9.1   Liver Function Tests:  Recent Labs Lab 05/12/15 0159  AST 26  ALT 11*  ALKPHOS 59  BILITOT 0.5  PROT 5.7*  ALBUMIN 3.1*   CBC:  Recent Labs Lab 05/12/15 0159  WBC 8.3  NEUTROABS 5.8  HGB 11.2*  HCT 33.4*  MCV 94.7  PLT 204     Studies: Dg Chest Port 1 View  05/12/2015   CLINICAL DATA:  Weakness and bright red bloody stools. History of breast cancer with mastectomy.  EXAM: PORTABLE CHEST - 1 VIEW  COMPARISON:  04/23/2015  FINDINGS: Borderline heart size without vascular congestion. Emphysematous changes in the lungs. Central interstitial changes suggest chronic bronchitis. No focal consolidation or airspace disease. No blunting of costophrenic angles. No pneumothorax. Surgical clips in the right axilla. Calcification of the aorta.  IMPRESSION: Emphysematous changes in the lungs with central chronic bronchitic change. No evidence of active pulmonary disease.  Electronically Signed   By: Lucienne Capers M.D.   On: 05/12/2015 03:40    Scheduled Meds: . budesonide-formoterol  2 puff Inhalation BID  . levothyroxine  50 mcg Oral QAC breakfast  . magnesium oxide  400 mg Oral BID  . metoprolol tartrate  25 mg Oral BID   Continuous Infusions: . sodium chloride      Assessment/Plan:  1. Lower GI bleed, likely diverticular in nature. I will monitor for further bleeding. Get serial hemoglobins. Clear liquid diet. If further hemorrhaging I can obtain a bleeding scan. Decrease rate of IV fluids and  somebody who is 81 years old. Patient has never had a colonoscopy. 2. Relative hypotension hold blood pressure medications. 3. Hypothyroidism unspecified continue levothyroxine. 4. Weakness physical therapy evaluation.  Code Status:     Code Status Orders        Start     Ordered   05/12/15 0558  Full code   Continuous     05/12/15 0557    Advance Directive Documentation        Most Recent Value   Type of Advance Directive  Healthcare Power of Attorney, Living will   Pre-existing out of facility DNR order (yellow form or pink MOST form)     "MOST" Form in Place?       Disposition Plan: Back to facility once bleeding on  Time spent: 35 minutes  Loletha Grayer  [facility]

## 2015-05-13 ENCOUNTER — Inpatient Hospital Stay: Payer: Medicare Other

## 2015-05-13 LAB — PREPARE RBC (CROSSMATCH)

## 2015-05-13 LAB — BASIC METABOLIC PANEL
Anion gap: 6 (ref 5–15)
BUN: 30 mg/dL — AB (ref 6–20)
CALCIUM: 8.5 mg/dL — AB (ref 8.9–10.3)
CO2: 26 mmol/L (ref 22–32)
CREATININE: 1.23 mg/dL — AB (ref 0.44–1.00)
Chloride: 107 mmol/L (ref 101–111)
GFR calc Af Amer: 42 mL/min — ABNORMAL LOW (ref >60)
GFR, EST NON AFRICAN AMERICAN: 36 mL/min — AB (ref >60)
GLUCOSE: 102 mg/dL — AB (ref 65–99)
POTASSIUM: 4.4 mmol/L (ref 3.5–5.1)
SODIUM: 139 mmol/L (ref 135–145)

## 2015-05-13 LAB — CBC
HCT: 25.8 % — ABNORMAL LOW (ref 35.0–47.0)
Hemoglobin: 8.6 g/dL — ABNORMAL LOW (ref 12.0–16.0)
MCH: 31.5 pg (ref 26.0–34.0)
MCHC: 33.6 g/dL (ref 32.0–36.0)
MCV: 93.9 fL (ref 80.0–100.0)
PLATELETS: 161 10*3/uL (ref 150–440)
RBC: 2.74 MIL/uL — AB (ref 3.80–5.20)
RDW: 12.4 % (ref 11.5–14.5)
WBC: 6.7 10*3/uL (ref 3.6–11.0)

## 2015-05-13 LAB — HEMOGLOBIN: HEMOGLOBIN: 9 g/dL — AB (ref 12.0–16.0)

## 2015-05-13 MED ORDER — SODIUM CHLORIDE 0.9 % IV BOLUS (SEPSIS)
500.0000 mL | Freq: Once | INTRAVENOUS | Status: AC
  Administered 2015-05-13: 500 mL via INTRAVENOUS

## 2015-05-13 MED ORDER — SODIUM CHLORIDE 0.9 % IV SOLN: Freq: Once | INTRAVENOUS | Status: DC

## 2015-05-13 MED ORDER — TECHNETIUM TC 99M-LABELED RED BLOOD CELLS IV KIT
22.2000 | PACK | Freq: Once | INTRAVENOUS | Status: AC | PRN
  Administered 2015-05-13: 22.2 via INTRAVENOUS

## 2015-05-13 MED ORDER — SODIUM CHLORIDE 0.9 % IV SOLN
Freq: Once | INTRAVENOUS | Status: AC
  Administered 2015-05-13: 22:00:00 via INTRAVENOUS

## 2015-05-13 NOTE — Progress Notes (Signed)
Patient called staff to room stating that she is weak, c/o nausea, and feels like she is about to pass out.  Patient also stated that she is hemorrhaging.  Dark red fluid noted in the patients brief and continues to pass the same as of now. B/P 99/63. HR 93. Dr. Edwina Barth notified of the above.  New orders received to transfuse an additional unit of blood after the current completes and give a fluid bolus.  Orders initiated. Nursing Supervisor made aware of the patient's current condition. Patient has requested that her family not be called at this time.  Will continue to monitor.

## 2015-05-13 NOTE — Progress Notes (Signed)
Patient ID: Lisa Fitzgerald, female   DOB: May 09, 1918, 81 y.o.   MRN: [id number] [facility] PROGRESS NOTE  PCP: No primary care provider on file.  HPI/Subjective: Continues to have bleeding per rectum now reports that his dark color in nature  Objective: Filed Vitals:   05/13/15 0823  BP: 119/61  Pulse: 75  Temp: 98 F (36.7 C)  Resp: 16    Intake/Output Summary (Last 24 hours) at 05/13/15 1312 Last data filed at 05/13/15 1114  Gross per 24 hour  Intake   1174 ml  Output    400 ml  Net    774 ml   Filed Weights   05/12/15 0152 05/12/15 0605 05/13/15 0407  Weight: 83.008 kg (183 lb) 81.511 kg (179 lb 11.2 oz) 84.052 kg (185 lb 4.8 oz)    ROS: Review of Systems  Constitutional: Negative for fever and chills.  Eyes: Negative for blurred vision.  Respiratory: Negative for cough and shortness of breath.   Cardiovascular: Negative for chest pain.  Gastrointestinal: Positive for abdominal pain, diarrhea and blood in stool. Negative for nausea, vomiting and constipation.  Genitourinary: Negative for dysuria.  Musculoskeletal: Negative for joint pain.  Neurological: Negative for dizziness and headaches.   Exam: Physical Exam  Constitutional: She is oriented to person, place, and time.  HENT:  Nose: No mucosal edema.  Mouth/Throat: No oropharyngeal exudate or posterior oropharyngeal edema.  Eyes: Conjunctivae, EOM and lids are normal. Pupils are equal, round, and reactive to light.  Neck: No JVD present. Carotid bruit is not present. No edema present. No thyroid mass and no thyromegaly present.  Cardiovascular: S1 normal and S2 normal.  Exam reveals no gallop.   Murmur heard.  Systolic murmur is present with a grade of 4/6  Pulses:      Dorsalis pedis pulses are 2+ on the right side, and 2+ on the left side.  Respiratory: No respiratory distress. She has no wheezes. She has no rhonchi. She has no rales.  GI: Soft. Bowel sounds are normal. There is no  tenderness.  Musculoskeletal:       Right ankle: She exhibits no swelling.       Left ankle: She exhibits no swelling.  Lymphadenopathy:    She has no cervical adenopathy.  Neurological: She is alert and oriented to person, place, and time. No cranial nerve deficit.  Skin: Skin is warm. No rash noted. Nails show no clubbing.  Psychiatric: She has a normal mood and affect.    Data Reviewed: Basic Metabolic Panel:  Recent Labs Lab 05/12/15 0159 05/13/15 0222  NA 136 139  K 4.0 4.4  CL 103 107  CO2 24 26  GLUCOSE 143* 102*  BUN 30* 30*  CREATININE 1.60* 1.23*  CALCIUM 9.1 8.5*   Liver Function Tests:  Recent Labs Lab 05/12/15 0159  AST 26  ALT 11*  ALKPHOS 59  BILITOT 0.5  PROT 5.7*  ALBUMIN 3.1*   CBC:  Recent Labs Lab 05/12/15 0159 05/12/15 1036 05/12/15 1811 05/13/15 0222 05/13/15 1017  WBC 8.3  --   --  6.7  --   NEUTROABS 5.8  --   --   --   --   HGB 11.2* 10.7* 10.5* 8.6* 9.0*  HCT 33.4*  --   --  25.8*  --   MCV 94.7  --   --  93.9  --   PLT 204  --   --  161  --      Studies:  Dg Chest Port 1 View  05/12/2015   CLINICAL DATA:  Weakness and bright red bloody stools. History of breast cancer with mastectomy.  EXAM: PORTABLE CHEST - 1 VIEW  COMPARISON:  04/23/2015  FINDINGS: Borderline heart size without vascular congestion. Emphysematous changes in the lungs. Central interstitial changes suggest chronic bronchitis. No focal consolidation or airspace disease. No blunting of costophrenic angles. No pneumothorax. Surgical clips in the right axilla. Calcification of the aorta.  IMPRESSION: Emphysematous changes in the lungs with central chronic bronchitic change. No evidence of active pulmonary disease.   Electronically Signed   By: Lucienne Capers M.D.   On: 05/12/2015 03:40    Scheduled Meds: . sodium chloride   Intravenous Once  . budesonide-formoterol  2 puff Inhalation BID  . levothyroxine  50 mcg Oral QAC breakfast  . magnesium oxide  400 mg Oral  BID  . metoprolol tartrate  25 mg Oral BID   Continuous Infusions: . sodium chloride 30 mL/hr at 05/12/15 1142    Assessment/Plan:  1. Lower GI bleed, likely diverticular in nature. Has active bleed stat bleeding scan has been ordered  2. Relative hypotension hold blood pressure medications. 3. Hypothyroidism unspecified continue levothyroxine. 4. Anemia due to acute blood loss transfused 1 unit of packed RBCs due to the patient having a again large blood bloody bowel movement  Code Status: Discussed with patient she wants a DO NOT RESUSCITATE    Code Status Orders        Start     Ordered   05/12/15 0558  Full code   Continuous     05/12/15 0557    Advance Directive Documentation        Most Recent Value   Type of Advance Directive  Healthcare Power of Attorney, Living will   Pre-existing out of facility DNR order (yellow form or pink MOST form)     "MOST" Form in Place?       Disposition Plan: Back to facility once bleeding on  Time spent: 35 minutes  Chealsea Paske, [facility]

## 2015-05-13 NOTE — Clinical Documentation Improvement (Signed)
Internal Medicine  Abnormal Lab/Test Results:  -- BUN/Cr:  8/27 30/1.23 -- GFR:  8/27 36/42  Possible Clinical Conditions associated with below indicators   AKI  Other Condition  Cannot Clinically Determine  Treatment Provided: --  NS IV 30 ml/hr   Please exercise your independent, professional judgment when responding. A specific answer is not anticipated or expected.   Thank You,  Dixie

## 2015-05-13 NOTE — Progress Notes (Signed)
GI Note:  Further bleeding today but tagged scan negative.  Likely Aortic aneurysm on imaging study.  This is more likely stuttering diverticular bleed than aorto-enteric fistula.   Recs: - stat Hgb - transfuse for Hgb < 7 - palliaive care consult - repeat bleeding scan for further active bleeding.

## 2015-05-13 NOTE — Clinical Social Work Note (Signed)
Clinical Social Work Assessment  Patient Details  Name: Lisa Fitzgerald MRN: [id number] Date of Birth: [birth year]  Date of referral:  05/13/15               Reason for consult:   (from [facility] ( ALF) )                Permission sought to share information with:  Family Supports, Customer service manager Permission granted to share information::  Yes, Verbal Permission Granted  Name::      (daughter   [name]  [phone]  HPOA)  Agency::   [facility])  Relationship::     Contact Information:     Housing/Transportation Living arrangements for the past 2 months:  [place], [place] of Information:  Patient, Adult Children Patient Interpreter Needed:  None Criminal Activity/Legal Involvement Pertinent to Current Situation/Hospitalization:  No - Comment as needed Significant Relationships:  Adult Children (ALF) Lives with:  Facility Resident Do you feel safe going back to the place where you live?  Yes Need for family participation in patient care:  Yes (Comment)  Care giving concerns:  Get patient back to baseline.    Social Worker assessment / plan:  Patient is 81 year old female that currently resided at [facility].  Patient is in good spirits and engaged in conversation with CSW.  Patient introduced her only daughter [name] who was at bedside.  Patient was a delight to talk with and shared with CSW her experience at [facility].  Patient ok with daughter remaining in the room while talking with CSW. Daughter assisted with providing information.   Patient has been at [facility] for about 3 weeks.  Per daughter this has been a slow adjustment.  Prior to [facility] patient lived with daughter for about 11 years.  While at [facility] patient utilizes a rolling walker to ambulate.  Per daughter the plan is for patient to return to [facility] at discharge.  CSW will continue to follow patient and assist with ongoing as well as disposition needs. FL2  completed and placed on the chart in preparation for discharge once patient is medically ready.   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   Patient/Family's Understanding of and Emotional Response to Diagnosis, Current Treatment, and Prognosis:  Patient states she just met with MD and understands they are doing their best to treat her.  Patient is in good spirits and appreciative of the care she is receiving.   Emotional Assessment Appearance:  Appears younger than stated age Attitude/Demeanor/Rapport:   (appropritate ) Affect (typically observed):  Accepting, Adaptable, Appropriate, Calm, Pleasant Orientation:  Oriented to Self, Oriented to Place, Oriented to  Time, Oriented to Situation Alcohol / Substance use:  Never Used Psych involvement (Current and /or in the community):   (none reported)  Discharge Needs  Concerns to be addressed:  Denies Needs/Concerns at this time Readmission within the last 30 days:    Current discharge risk:  Chronically ill Barriers to Discharge:  No Barriers Identified   Maurine Cane, LCSW 05/13/2015, 11:06 AM Casimer Lanius. Latanya Presser, MSW Clinical Social Work Department Emergency Room [phone] 11:11 AM

## 2015-05-13 NOTE — Progress Notes (Signed)
Patient ID: Lisa Fitzgerald, female   DOB: [birth year], 81 y.o.   MRN: [id number]  Called by nurse to see patient for large amount of blood coming out of rectum. Patient awake and alert. Nauseated. Visulaizing large amount of red blood flowing from rectum. BP in mid 90's. Patient is pale and short of breath.   A/P: 1. Anemia: Sec to acute blood loss. Currently getting 1 unit of blood. Have ordered additional unit. Recheck HgB after transfusions.  2. GI Bleed: Reviewed bleeding scan done late today. With these findings suspect she could have an aortic fistula. Discussed with daughter and she is a poor surgical candidate. Family understands and does not wish surgery.   3. Prognosis: Discussed with family. Very poor. Daughter is coming to hospital. Dispite blood transfusion patient is high risk of death.   Critical care time: 84min

## 2015-05-13 NOTE — Progress Notes (Signed)
Patient still passing bloody stool, down for bleeding scan at this time , for 1 unit of PRBC today.endorsed

## 2015-05-13 NOTE — Progress Notes (Signed)
patient still continue to pass dark bloody stool MD at bedside bleeding scan order , and pt to have 1 unit of PRBC., will continue to monitor

## 2015-05-14 ENCOUNTER — Encounter: Payer: Self-pay | Admitting: Radiology

## 2015-05-14 ENCOUNTER — Inpatient Hospital Stay: Payer: Medicare Other

## 2015-05-14 LAB — BASIC METABOLIC PANEL
Anion gap: 6 (ref 5–15)
BUN: 33 mg/dL — AB (ref 6–20)
CALCIUM: 8.2 mg/dL — AB (ref 8.9–10.3)
CO2: 23 mmol/L (ref 22–32)
CREATININE: 1.25 mg/dL — AB (ref 0.44–1.00)
Chloride: 110 mmol/L (ref 101–111)
GFR, EST AFRICAN AMERICAN: 41 mL/min — AB (ref >60)
GFR, EST NON AFRICAN AMERICAN: 35 mL/min — AB (ref >60)
Glucose, Bld: 134 mg/dL — ABNORMAL HIGH (ref 65–99)
Potassium: 4.6 mmol/L (ref 3.5–5.1)
SODIUM: 139 mmol/L (ref 135–145)

## 2015-05-14 LAB — CBC
HCT: 29.6 % — ABNORMAL LOW (ref 35.0–47.0)
Hemoglobin: 9.8 g/dL — ABNORMAL LOW (ref 12.0–16.0)
MCH: 29.3 pg (ref 26.0–34.0)
MCHC: 33.1 g/dL (ref 32.0–36.0)
MCV: 88.4 fL (ref 80.0–100.0)
Platelets: 137 10*3/uL — ABNORMAL LOW (ref 150–440)
RBC: 3.34 MIL/uL — ABNORMAL LOW (ref 3.80–5.20)
RDW: 16.3 % — ABNORMAL HIGH (ref 11.5–14.5)
WBC: 11.3 10*3/uL — ABNORMAL HIGH (ref 3.6–11.0)

## 2015-05-14 LAB — PREPARE RBC (CROSSMATCH)

## 2015-05-14 LAB — HEMOGLOBIN: HEMOGLOBIN: 9.5 g/dL — AB (ref 12.0–16.0)

## 2015-05-14 MED ORDER — SODIUM CHLORIDE 0.9 % IV BOLUS (SEPSIS)
500.0000 mL | Freq: Once | INTRAVENOUS | Status: AC
  Administered 2015-05-14: 500 mL via INTRAVENOUS

## 2015-05-14 MED ORDER — IOHEXOL 350 MG/ML SOLN
80.0000 mL | Freq: Once | INTRAVENOUS | Status: AC | PRN
  Administered 2015-05-14: 80 mL via INTRAVENOUS

## 2015-05-14 MED ORDER — SODIUM CHLORIDE 0.9 % IV SOLN: Freq: Once | INTRAVENOUS | Status: DC

## 2015-05-14 NOTE — Progress Notes (Signed)
Patient ID: Lisa Fitzgerald, female   DOB: 06-Mar-1918, 81 y.o.   MRN: [id number] [facility] PROGRESS NOTE  PCP: No primary care provider on file.  HPI/Subjective: Patient had a large amount of bleeding yesterday. Requiring fluid bolus transfused blood she had a bleeding scan that was negative. However bleeding scan did suggest an aneurysm.  Objective: Filed Vitals:   05/14/15 1146  BP: 84/46  Pulse: 100  Temp:   Resp:     Intake/Output Summary (Last 24 hours) at 05/14/15 1149 Last data filed at 05/14/15 1132  Gross per 24 hour  Intake   1411 ml  Output      2 ml  Net   1409 ml   Filed Weights   05/12/15 0605 05/13/15 0407 05/14/15 0500  Weight: 81.511 kg (179 lb 11.2 oz) 84.052 kg (185 lb 4.8 oz) 80.423 kg (177 lb 4.8 oz)    ROS: Review of Systems  Constitutional: Negative for fever and chills.  Eyes: Negative for blurred vision.  Respiratory: Negative for cough and shortness of breath.   Cardiovascular: Negative for chest pain.  Gastrointestinal: Positive for abdominal pain, diarrhea and blood in stool. Negative for nausea, vomiting and constipation.  Genitourinary: Negative for dysuria.  Musculoskeletal: Negative for joint pain.  Neurological: Negative for dizziness and headaches.   Exam: Physical Exam  Constitutional: She is oriented to person, place, and time.  HENT:  Nose: No mucosal edema.  Mouth/Throat: No oropharyngeal exudate or posterior oropharyngeal edema.  Eyes: Conjunctivae, EOM and lids are normal. Pupils are equal, round, and reactive to light.  Neck: No JVD present. Carotid bruit is not present. No edema present. No thyroid mass and no thyromegaly present.  Cardiovascular: S1 normal and S2 normal.  Exam reveals no gallop.   Murmur heard.  Systolic murmur is present with a grade of 4/6  Pulses:      Dorsalis pedis pulses are 2+ on the right side, and 2+ on the left side.  Respiratory: No respiratory distress. She has no wheezes. She has  no rhonchi. She has no rales.  GI: Soft. Bowel sounds are normal. There is no tenderness.  Musculoskeletal:       Right ankle: She exhibits no swelling.       Left ankle: She exhibits no swelling.  Lymphadenopathy:    She has no cervical adenopathy.  Neurological: She is alert and oriented to person, place, and time. No cranial nerve deficit.  Skin: Skin is warm. No rash noted. Nails show no clubbing.  Psychiatric: She has a normal mood and affect.    Data Reviewed: Basic Metabolic Panel:  Recent Labs Lab 05/12/15 0159 05/13/15 0222 05/14/15 0202  NA 136 139 139  K 4.0 4.4 4.6  CL 103 107 110  CO2 24 26 23   GLUCOSE 143* 102* 134*  BUN 30* 30* 33*  CREATININE 1.60* 1.23* 1.25*  CALCIUM 9.1 8.5* 8.2*   Liver Function Tests:  Recent Labs Lab 05/12/15 0159  AST 26  ALT 11*  ALKPHOS 59  BILITOT 0.5  PROT 5.7*  ALBUMIN 3.1*   CBC:  Recent Labs Lab 05/12/15 0159  05/12/15 1811 05/13/15 0222 05/13/15 1017 05/14/15 0202 05/14/15 0934  WBC 8.3  --   --  6.7  --  11.3*  --   NEUTROABS 5.8  --   --   --   --   --   --   HGB 11.2*  < > 10.5* 8.6* 9.0* 9.8* 9.5*  HCT 33.4*  --   --  25.8*  --  29.6*  --   MCV 94.7  --   --  93.9  --  88.4  --   PLT 204  --   --  161  --  137*  --   < > = values in this interval not displayed.   Studies: Nm Gi Blood Loss  05/13/2015   CLINICAL DATA:  GI bleeding beginning 2 days ago  EXAM: NUCLEAR MEDICINE GASTROINTESTINAL BLEEDING SCAN  TECHNIQUE: Sequential abdominal images were obtained following intravenous administration of Tc-15m labeled red blood cells.  RADIOPHARMACEUTICALS:  22.2 mCi Tc-48m in-vitro labeled autologous red cells.  COMPARISON:  None ; no cross-sectional imaging available for correlation.  FINDINGS: Initial normal blood pool distribution of tracer.  Larger rounded area of abnormal tracer localization identified in the upper mid abdomen slightly LEFT of midline, overlying the expected position of the aorta.  This  collection remains unchanged throughout the 2 hours of imaging.  No progression of tracer from this site to suggest this is within bowel.  Finding is suspicious for an aortic aneurysm.  No abnormal GI localization of tracer identified throughout the exam to indicate active gastrointestinal bleeding.  IMPRESSION: No scintigraphic evidence of GI bleeding.  Abnormal collection of labeled erythrocytes in the upper mid abdomen LEFT of midline, persisting throughout exam, raising question of abdominal aortic aneurysm.  CT imaging recommended to assess.  Findings called to Dr. Posey Pronto on 05/13/2015 at 1558 hours.   Electronically Signed   By: Lavonia Dana M.D.   On: 05/13/2015 15:58   Ct Angio Abd/pel W/ And/or W/o  05/14/2015   CLINICAL DATA:  Rectal bleeding.  Concern for aorta colonic fistula  EXAM: CTA ABDOMEN AND PELVIS wITHOUT AND WITH CONTRAST  TECHNIQUE: Multidetector CT imaging of the abdomen and pelvis was performed using the standard protocol during bolus administration of intravenous contrast. Multiplanar reconstructed images and MIPs were obtained and reviewed to evaluate the vascular anatomy.  CONTRAST:  59mL OMNIPAQUE IOHEXOL 350 MG/ML SOLN  COMPARISON:  None.  FINDINGS: Minimal areas of atelectasis or scarring in the lung bases. There are several small peripheral nodules in the lung bases bilaterally. No effusions. Heart is upper limits normal in size. Densely calcified mitral valve. Coronary artery scratch head diffuse right coronary artery calcifications noted. Small hiatal hernia.  Prior cholecystectomy. Liver, spleen, pancreas unremarkable. Small nodules in the adrenal glands bilaterally, nonspecific, but most likely adenomas. 3.4 cm cyst in the midpole of the right kidney. Smaller cysts in the kidneys bilaterally. No hydronephrosis.  Large aortic aneurysm measuring up to 7.2 cm. Moderate to large amount of mural plaque. The aneurysm begins just below the renal artery and hands at the aortic  bifurcation. Severe atherosclerotic disease in the iliac vessels with moderate to high-grade stenosis in the proximal right external iliac artery. Atherosclerotic calcifications at the origins of and in the proximal celiac artery and superior mesenteric artery. Inferior mesenteric artery not visualize, likely occluded left renal artery is severely calcified.  Extensive descending colonic and sigmoid diverticulosis. No active diverticulitis. Stomach and small bowel are unremarkable. With no evidence of aortoenteric fistula.  Uterus, adnexae and urinary bladder are unremarkable. Small bilateral inguinal hernias containing fat.  Review of the MIP images confirms the above findings.  IMPRESSION: 7.2 cm infrarenal abdominal aortic aneurysm with moderate mural plaque. No evidence of aortoenteric fistula.  Tight stenosis in the proximal right external iliac artery. Heavily calcified left renal artery, suspect hemodynamically significant stenosis.  Celiac artery and superior mesenteric  artery are patent with moderate to advanced atherosclerotic calcifications. 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  Diffuse colonic diverticulosis, most pronounced in the sigmoid colon. No evidence of active diverticulitis.  Small peripheral bilateral lower lobe pulmonary nodules, nonspecific.   Electronically Signed   By: Rolm Baptise M.D.   On: 05/14/2015 10:19    Scheduled Meds: . sodium chloride   Intravenous Once  . budesonide-formoterol  2 puff Inhalation BID  . levothyroxine  50 mcg Oral QAC breakfast  . magnesium oxide  400 mg Oral BID   Continuous Infusions: . sodium chloride 30 mL/hr at 05/12/15 1142    Assessment/Plan:  1. Lower GI bleed, likely diverticular in nature. Bleeding scan negative however there was persistent blood in the aorta suggestive of abdominal aortic aneurysm I will get a CT angiogram to rule out aorto colonic fistula 2. Relative hypotension hold blood pressure medications. 3. Hypothyroidism  unspecified continue levothyroxine. 4. Anemia due to acute blood loss transfused 2unit of packed RBCs due to the patient having a again large blood bloody bowel movement  Code Status: Discussed with patient she wants a DO NOT RESUSCITATE    Code Status Orders        Start     Ordered   05/12/15 0558  Full code   Continuous     05/12/15 0557    Advance Directive Documentation        Most Recent Value   Type of Advance Directive  Healthcare Power of Attorney, Living will   Pre-existing out of facility DNR order (yellow form or pink MOST form)     "MOST" Form in Place?       Disposition Plan: Back to facility once bleeding on  Time spent: 35 minutes  Emilygrace Grothe, [facility]

## 2015-05-14 NOTE — Progress Notes (Signed)
   05/14/15 1250  Clinical Encounter Type  Visited With Patient and family together  Visit Type Initial  Referral From Physician  Consult/Referral To Chaplain  Spiritual Encounters  Spiritual Needs Sacred text;Prayer  Stress Factors  Patient Stress Factors Exhausted;Health changes  Family Stress Factors Family relationships;Health changes;Major life changes  Met w/patient while awaiting family. Provided comfort support and pastoral care. Will follow up after family visit.  Chap. Dorrene Bently G. [place]

## 2015-05-14 NOTE — Progress Notes (Signed)
Blood transfusion initiated as ordered.  B/P 89/55 post 15 minutes.  Spoke with Dr. Posey Pronto regarding patients blood pressure.  Orders received to give the blood transfusion wide open. Nursing supervisor made aware of this.  No sign/symptoms of reaction noted.  Patient remains alert and oriented at this time. Family at the bedside and made aware of her condition.

## 2015-05-14 NOTE — Progress Notes (Signed)
Patient noted to have a large liquid BM which appeared to be bright red blood. Patient immediately had a second BM which was noted to contain large visible blood clots.  Patient complained of feeling weak and nauseated afterwards.  B/P 79/52. B/P recheck and noted at 80/39. HR 104. Dr. Posey Pronto notified of the above.  New orders received to give patient a 547ml. NS bolus.  Orders initiated.  Will continue to monitor.

## 2015-05-14 NOTE — Progress Notes (Signed)
Ordered fluid bolus complete.  Patient continues to c/o feeling weak and nauseated.  Also c/o mouth feeling dry and her skin appears to be pale.  Blood pressure  84/46 post fluid bolus.  Notified Dr. Posey Pronto of the above. MD states he will come see this patient. New orders received to increase the maintenance fluids rate to 135ml/hr. Orders initiated.  Patient resting with eye closed at this time.   Will continue to monitor.

## 2015-05-14 NOTE — Progress Notes (Signed)
Patient ID: Lisa Fitzgerald, female   DOB: 07-05-1918, 81 y.o.   MRN: [id number] [facility] PROGRESS NOTE  PCP: No primary care provider on file.  HPI/Subjective: Called by nurse due to patient having another large bloody bowel movement with blood pressure into the 80s. A shin is feeling very weak and tired   Objective: Filed Vitals:   05/14/15 1146  BP: 84/46  Pulse: 100  Temp:   Resp:     Intake/Output Summary (Last 24 hours) at 05/14/15 1201 Last data filed at 05/14/15 1132  Gross per 24 hour  Intake   1411 ml  Output      2 ml  Net   1409 ml   Filed Weights   05/12/15 0605 05/13/15 0407 05/14/15 0500  Weight: 81.511 kg (179 lb 11.2 oz) 84.052 kg (185 lb 4.8 oz) 80.423 kg (177 lb 4.8 oz)    ROS: Review of Systems  Constitutional: Negative for fever and chills.  Eyes: Negative for blurred vision.  Respiratory: Negative for cough and shortness of breath.   Cardiovascular: Negative for chest pain.  Gastrointestinal: Positive for abdominal pain, diarrhea and blood in stool. Negative for nausea, vomiting and constipation.  Genitourinary: Negative for dysuria.  Musculoskeletal: Negative for joint pain.  Neurological: Negative for dizziness and headaches.   Exam: Physical Exam  Constitutional: She is oriented to person, place, and time.  HENT:  Nose: No mucosal edema.  Mouth/Throat: No oropharyngeal exudate or posterior oropharyngeal edema.  Eyes: Conjunctivae, EOM and lids are normal. Pupils are equal, round, and reactive to light.  Neck: No JVD present. Carotid bruit is not present. No edema present. No thyroid mass and no thyromegaly present.  Cardiovascular: S1 normal and S2 normal.  Exam reveals no gallop.   Murmur heard.  Systolic murmur is present with a grade of 4/6  Pulses:      Dorsalis pedis pulses are 2+ on the right side, and 2+ on the left side.  Respiratory: No respiratory distress. She has no wheezes. She has no rhonchi. She has no rales.   GI: Soft. Bowel sounds are normal. There is no tenderness.  Musculoskeletal:       Right ankle: She exhibits no swelling.       Left ankle: She exhibits no swelling.  Lymphadenopathy:    She has no cervical adenopathy.  Neurological: She is alert and oriented to person, place, and time. No cranial nerve deficit.  Skin: Skin is warm. No rash noted. Nails show no clubbing.  Psychiatric: She has a normal mood and affect.    Data Reviewed: Basic Metabolic Panel:  Recent Labs Lab 05/12/15 0159 05/13/15 0222 05/14/15 0202  NA 136 139 139  K 4.0 4.4 4.6  CL 103 107 110  CO2 24 26 23   GLUCOSE 143* 102* 134*  BUN 30* 30* 33*  CREATININE 1.60* 1.23* 1.25*  CALCIUM 9.1 8.5* 8.2*   Liver Function Tests:  Recent Labs Lab 05/12/15 0159  AST 26  ALT 11*  ALKPHOS 59  BILITOT 0.5  PROT 5.7*  ALBUMIN 3.1*   CBC:  Recent Labs Lab 05/12/15 0159  05/12/15 1811 05/13/15 0222 05/13/15 1017 05/14/15 0202 05/14/15 0934  WBC 8.3  --   --  6.7  --  11.3*  --   NEUTROABS 5.8  --   --   --   --   --   --   HGB 11.2*  < > 10.5* 8.6* 9.0* 9.8* 9.5*  HCT 33.4*  --   --  25.8*  --  29.6*  --   MCV 94.7  --   --  93.9  --  88.4  --   PLT 204  --   --  161  --  137*  --   < > = values in this interval not displayed.   Studies: Nm Gi Blood Loss  05/13/2015   CLINICAL DATA:  GI bleeding beginning 2 days ago  EXAM: NUCLEAR MEDICINE GASTROINTESTINAL BLEEDING SCAN  TECHNIQUE: Sequential abdominal images were obtained following intravenous administration of Tc-37m labeled red blood cells.  RADIOPHARMACEUTICALS:  22.2 mCi Tc-36m in-vitro labeled autologous red cells.  COMPARISON:  None ; no cross-sectional imaging available for correlation.  FINDINGS: Initial normal blood pool distribution of tracer.  Larger rounded area of abnormal tracer localization identified in the upper mid abdomen slightly LEFT of midline, overlying the expected position of the aorta.  This collection remains unchanged  throughout the 2 hours of imaging.  No progression of tracer from this site to suggest this is within bowel.  Finding is suspicious for an aortic aneurysm.  No abnormal GI localization of tracer identified throughout the exam to indicate active gastrointestinal bleeding.  IMPRESSION: No scintigraphic evidence of GI bleeding.  Abnormal collection of labeled erythrocytes in the upper mid abdomen LEFT of midline, persisting throughout exam, raising question of abdominal aortic aneurysm.  CT imaging recommended to assess.  Findings called to Dr. Posey Pronto on 05/13/2015 at 1558 hours.   Electronically Signed   By: Lavonia Dana M.D.   On: 05/13/2015 15:58   Ct Angio Abd/pel W/ And/or W/o  05/14/2015   CLINICAL DATA:  Rectal bleeding.  Concern for aorta colonic fistula  EXAM: CTA ABDOMEN AND PELVIS wITHOUT AND WITH CONTRAST  TECHNIQUE: Multidetector CT imaging of the abdomen and pelvis was performed using the standard protocol during bolus administration of intravenous contrast. Multiplanar reconstructed images and MIPs were obtained and reviewed to evaluate the vascular anatomy.  CONTRAST:  81mL OMNIPAQUE IOHEXOL 350 MG/ML SOLN  COMPARISON:  None.  FINDINGS: Minimal areas of atelectasis or scarring in the lung bases. There are several small peripheral nodules in the lung bases bilaterally. No effusions. Heart is upper limits normal in size. Densely calcified mitral valve. Coronary artery scratch head diffuse right coronary artery calcifications noted. Small hiatal hernia.  Prior cholecystectomy. Liver, spleen, pancreas unremarkable. Small nodules in the adrenal glands bilaterally, nonspecific, but most likely adenomas. 3.4 cm cyst in the midpole of the right kidney. Smaller cysts in the kidneys bilaterally. No hydronephrosis.  Large aortic aneurysm measuring up to 7.2 cm. Moderate to large amount of mural plaque. The aneurysm begins just below the renal artery and hands at the aortic bifurcation. Severe atherosclerotic  disease in the iliac vessels with moderate to high-grade stenosis in the proximal right external iliac artery. Atherosclerotic calcifications at the origins of and in the proximal celiac artery and superior mesenteric artery. Inferior mesenteric artery not visualize, likely occluded left renal artery is severely calcified.  Extensive descending colonic and sigmoid diverticulosis. No active diverticulitis. Stomach and small bowel are unremarkable. With no evidence of aortoenteric fistula.  Uterus, adnexae and urinary bladder are unremarkable. Small bilateral inguinal hernias containing fat.  Review of the MIP images confirms the above findings.  IMPRESSION: 7.2 cm infrarenal abdominal aortic aneurysm with moderate mural plaque. No evidence of aortoenteric fistula.  Tight stenosis in the proximal right external iliac artery. Heavily calcified left renal artery, suspect hemodynamically significant stenosis.  Celiac artery and superior mesenteric  artery are patent with moderate to advanced atherosclerotic calcifications. 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  Diffuse colonic diverticulosis, most pronounced in the sigmoid colon. No evidence of active diverticulitis.  Small peripheral bilateral lower lobe pulmonary nodules, nonspecific.   Electronically Signed   By: Rolm Baptise M.D.   On: 05/14/2015 10:19    Scheduled Meds: . sodium chloride   Intravenous Once  . sodium chloride   Intravenous Once  . budesonide-formoterol  2 puff Inhalation BID  . levothyroxine  50 mcg Oral QAC breakfast  . magnesium oxide  400 mg Oral BID   Continuous Infusions: . sodium chloride 130 mL/hr at 05/14/15 1154    Assessment/Plan:  Lower GI bleed, severe and persistent. Likely diverticular bleed. Had a CT angiogram of the abdomen which shows a normal size abdominal aortic aneurysm which is at very high risk of rupture. Patient understands that she is very sick. At this time will give her him 2 units of packed RBCs.. I  will update the family regarding patient's some current condition and prognosis. She was not able to tolerate the bleeding scan yesterday. And did not want this halfway through so will likely not be able to do this again. With the aneurysm on her prognosis is very poor.    Code Status: Discussed with patient she wants a DO NOT RESUSCITATE    Code Status Orders        Start     Ordered   05/12/15 0558  Full code   Continuous     05/12/15 0557    Advance Directive Documentation        Most Recent Value   Type of Advance Directive  Healthcare Power of Attorney, Living will   Pre-existing out of facility DNR order (yellow form or pink MOST form)     "MOST" Form in Place?       Disposition Plan: Back to facility once bleeding on  Time spent:  55 minutes  Liberta Gimpel, [facility] [place]

## 2015-05-14 NOTE — Progress Notes (Signed)
GI Note:  Continues to bleed, getting prbc.   I have recommend repeat tagged scan but pt declines currently, saying it is too much to go through.   Recs: - need palliative care consult for goals of care, outcome will likely be poor here.  - would obtain tagged scan if she want to continue to treat aggresively and decides to pursue.

## 2015-05-15 LAB — TYPE AND SCREEN
ABO/RH(D): A POS
ANTIBODY SCREEN: NEGATIVE
UNIT DIVISION: 0
Unit division: 0
Unit division: 0
Unit division: 0

## 2015-05-15 LAB — CBC WITH DIFFERENTIAL/PLATELET
BASOS ABS: 0.1 10*3/uL (ref 0–0.1)
BASOS PCT: 1 %
EOS PCT: 0 %
Eosinophils Absolute: 0 10*3/uL (ref 0–0.7)
HCT: 29.8 % — ABNORMAL LOW (ref 35.0–47.0)
Hemoglobin: 9.8 g/dL — ABNORMAL LOW (ref 12.0–16.0)
Lymphocytes Relative: 10 %
Lymphs Abs: 1.9 10*3/uL (ref 1.0–3.6)
MCH: 30.4 pg (ref 26.0–34.0)
MCHC: 33 g/dL (ref 32.0–36.0)
MCV: 92.2 fL (ref 80.0–100.0)
MONO ABS: 1.7 10*3/uL — AB (ref 0.2–0.9)
Monocytes Relative: 9 %
Neutro Abs: 15.2 10*3/uL — ABNORMAL HIGH (ref 1.4–6.5)
Neutrophils Relative %: 80 %
PLATELETS: 117 10*3/uL — AB (ref 150–440)
RBC: 3.23 MIL/uL — ABNORMAL LOW (ref 3.80–5.20)
RDW: 15.4 % — AB (ref 11.5–14.5)
WBC: 19 10*3/uL — ABNORMAL HIGH (ref 3.6–11.0)

## 2015-05-15 LAB — BASIC METABOLIC PANEL
ANION GAP: 5 (ref 5–15)
BUN: 41 mg/dL — ABNORMAL HIGH (ref 6–20)
CALCIUM: 8 mg/dL — AB (ref 8.9–10.3)
CO2: 21 mmol/L — ABNORMAL LOW (ref 22–32)
CREATININE: 1.68 mg/dL — AB (ref 0.44–1.00)
Chloride: 116 mmol/L — ABNORMAL HIGH (ref 101–111)
GFR, EST AFRICAN AMERICAN: 29 mL/min — AB (ref >60)
GFR, EST NON AFRICAN AMERICAN: 25 mL/min — AB (ref >60)
GLUCOSE: 145 mg/dL — AB (ref 65–99)
Potassium: 4.8 mmol/L (ref 3.5–5.1)
Sodium: 142 mmol/L (ref 135–145)

## 2015-05-15 MED ORDER — MORPHINE SULFATE (PF) 2 MG/ML IV SOLN
1.0000 mg | Freq: Once | INTRAVENOUS | Status: AC
Start: 2015-05-15 — End: 2015-05-15
  Administered 2015-05-15: 1 mg via INTRAVENOUS
  Filled 2015-05-15: qty 1

## 2015-05-15 MED ORDER — NITROGLYCERIN 0.4 MG SL SUBL
0.4000 mg | SUBLINGUAL_TABLET | SUBLINGUAL | Status: DC | PRN
  Filled 2015-05-15: qty 1

## 2015-05-15 NOTE — Progress Notes (Signed)
PT Cancellation Note  Patient Details Name: Lisa Fitzgerald MRN: [id number] DOB: 09/23/1917   Cancelled Treatment:    Reason Eval/Treat Not Completed: Medical issues which prohibited therapy (Per MD note pt still with dark bloody stools and pt does not want any more blood transfusions; pt also with 7.2 cm infrarenal AAA and is at high risk for rupture.)  Per discussion with nursing, will hold PT at this time and re-attempt at a later date/time as medically appropriate.   [name] 05/15/2015, 1:21 PM [name], [place]

## 2015-05-15 NOTE — Progress Notes (Signed)
Patient ID: Lisa Fitzgerald, female   DOB: 21-Jan-1918, 81 y.o.   MRN: [id number] [facility] PROGRESS NOTE  PCP: No primary care provider on file.  HPI/Subjective:  Continues to have dark bloody stools. Patient now wishes no more blood transfusions. She understands that she is received multiple transfusions and is not wishing for any further testing or blood.  Objective: Filed Vitals:   05/15/15 0805  BP: 120/78  Pulse: 76  Temp: 98.4 F (36.9 C)  Resp:     Intake/Output Summary (Last 24 hours) at 05/15/15 1212 Last data filed at 05/15/15 1158  Gross per 24 hour  Intake 3111.73 ml  Output      0 ml  Net 3111.73 ml   Filed Weights   05/13/15 0407 05/14/15 0500 05/15/15 0019  Weight: 84.052 kg (185 lb 4.8 oz) 80.423 kg (177 lb 4.8 oz) 81.647 kg (180 lb)    ROS: Review of Systems  Constitutional: Negative for fever and chills.  Eyes: Negative for blurred vision.  Respiratory: Negative for cough and shortness of breath.   Cardiovascular: Negative for chest pain.  Gastrointestinal: Positive for abdominal pain, diarrhea and blood in stool. Negative for nausea, vomiting and constipation.  Genitourinary: Negative for dysuria.  Musculoskeletal: Negative for joint pain.  Neurological: Negative for dizziness and headaches.   Exam: Physical Exam  Constitutional: She is oriented to person, place, and time.  HENT:  Nose: No mucosal edema.  Mouth/Throat: No oropharyngeal exudate or posterior oropharyngeal edema.  Eyes: Conjunctivae, EOM and lids are normal. Pupils are equal, round, and reactive to light.  Neck: No JVD present. Carotid bruit is not present. No edema present. No thyroid mass and no thyromegaly present.  Cardiovascular: S1 normal and S2 normal.  Exam reveals no gallop.   Murmur heard.  Systolic murmur is present with a grade of 4/6  Pulses:      Dorsalis pedis pulses are 2+ on the right side, and 2+ on the left side.  Respiratory: No respiratory  distress. She has no wheezes. She has no rhonchi. She has no rales.  GI: Soft. Bowel sounds are normal. There is no tenderness.  Musculoskeletal:       Right ankle: She exhibits no swelling.       Left ankle: She exhibits no swelling.  Lymphadenopathy:    She has no cervical adenopathy.  Neurological: She is alert and oriented to person, place, and time. No cranial nerve deficit.  Skin: Skin is warm. No rash noted. Nails show no clubbing.  Psychiatric: She has a normal mood and affect.    Data Reviewed: Basic Metabolic Panel:  Recent Labs Lab 05/12/15 0159 05/13/15 0222 05/14/15 0202 05/15/15 0908  NA 136 139 139 142  K 4.0 4.4 4.6 4.8  CL 103 107 110 116*  CO2 24 26 23  21*  GLUCOSE 143* 102* 134* 145*  BUN 30* 30* 33* 41*  CREATININE 1.60* 1.23* 1.25* 1.68*  CALCIUM 9.1 8.5* 8.2* 8.0*   Liver Function Tests:  Recent Labs Lab 05/12/15 0159  AST 26  ALT 11*  ALKPHOS 59  BILITOT 0.5  PROT 5.7*  ALBUMIN 3.1*   CBC:  Recent Labs Lab 05/12/15 0159  05/13/15 0222 05/13/15 1017 05/14/15 0202 05/14/15 0934 05/15/15 0908  WBC 8.3  --  6.7  --  11.3*  --  19.0*  NEUTROABS 5.8  --   --   --   --   --  15.2*  HGB 11.2*  < > 8.6*  9.0* 9.8* 9.5* 9.8*  HCT 33.4*  --  25.8*  --  29.6*  --  29.8*  MCV 94.7  --  93.9  --  88.4  --  92.2  PLT 204  --  161  --  137*  --  117*  < > = values in this interval not displayed.   Studies: Nm Gi Blood Loss  05/13/2015   CLINICAL DATA:  GI bleeding beginning 2 days ago  EXAM: NUCLEAR MEDICINE GASTROINTESTINAL BLEEDING SCAN  TECHNIQUE: Sequential abdominal images were obtained following intravenous administration of Tc-64m labeled red blood cells.  RADIOPHARMACEUTICALS:  22.2 mCi Tc-43m in-vitro labeled autologous red cells.  COMPARISON:  None ; no cross-sectional imaging available for correlation.  FINDINGS: Initial normal blood pool distribution of tracer.  Larger rounded area of abnormal tracer localization identified in the upper  mid abdomen slightly LEFT of midline, overlying the expected position of the aorta.  This collection remains unchanged throughout the 2 hours of imaging.  No progression of tracer from this site to suggest this is within bowel.  Finding is suspicious for an aortic aneurysm.  No abnormal GI localization of tracer identified throughout the exam to indicate active gastrointestinal bleeding.  IMPRESSION: No scintigraphic evidence of GI bleeding.  Abnormal collection of labeled erythrocytes in the upper mid abdomen LEFT of midline, persisting throughout exam, raising question of abdominal aortic aneurysm.  CT imaging recommended to assess.  Findings called to Dr. Posey Pronto on 05/13/2015 at 1558 hours.   Electronically Signed   By: Lavonia Dana M.D.   On: 05/13/2015 15:58   Ct Angio Abd/pel W/ And/or W/o  05/14/2015   CLINICAL DATA:  Rectal bleeding.  Concern for aorta colonic fistula  EXAM: CTA ABDOMEN AND PELVIS wITHOUT AND WITH CONTRAST  TECHNIQUE: Multidetector CT imaging of the abdomen and pelvis was performed using the standard protocol during bolus administration of intravenous contrast. Multiplanar reconstructed images and MIPs were obtained and reviewed to evaluate the vascular anatomy.  CONTRAST:  3mL OMNIPAQUE IOHEXOL 350 MG/ML SOLN  COMPARISON:  None.  FINDINGS: Minimal areas of atelectasis or scarring in the lung bases. There are several small peripheral nodules in the lung bases bilaterally. No effusions. Heart is upper limits normal in size. Densely calcified mitral valve. Coronary artery scratch head diffuse right coronary artery calcifications noted. Small hiatal hernia.  Prior cholecystectomy. Liver, spleen, pancreas unremarkable. Small nodules in the adrenal glands bilaterally, nonspecific, but most likely adenomas. 3.4 cm cyst in the midpole of the right kidney. Smaller cysts in the kidneys bilaterally. No hydronephrosis.  Large aortic aneurysm measuring up to 7.2 cm. Moderate to large amount of mural  plaque. The aneurysm begins just below the renal artery and hands at the aortic bifurcation. Severe atherosclerotic disease in the iliac vessels with moderate to high-grade stenosis in the proximal right external iliac artery. Atherosclerotic calcifications at the origins of and in the proximal celiac artery and superior mesenteric artery. Inferior mesenteric artery not visualize, likely occluded left renal artery is severely calcified.  Extensive descending colonic and sigmoid diverticulosis. No active diverticulitis. Stomach and small bowel are unremarkable. With no evidence of aortoenteric fistula.  Uterus, adnexae and urinary bladder are unremarkable. Small bilateral inguinal hernias containing fat.  Review of the MIP images confirms the above findings.  IMPRESSION: 7.2 cm infrarenal abdominal aortic aneurysm with moderate mural plaque. No evidence of aortoenteric fistula.  Tight stenosis in the proximal right external iliac artery. Heavily calcified left renal artery, suspect hemodynamically significant  stenosis.  Celiac artery and superior mesenteric artery are patent with moderate to advanced atherosclerotic calcifications. 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  Diffuse colonic diverticulosis, most pronounced in the sigmoid colon. No evidence of active diverticulitis.  Small peripheral bilateral lower lobe pulmonary nodules, nonspecific.   Electronically Signed   By: Rolm Baptise M.D.   On: 05/14/2015 10:19    Scheduled Meds: . budesonide-formoterol  2 puff Inhalation BID  . levothyroxine  50 mcg Oral QAC breakfast  . magnesium oxide  400 mg Oral BID   Continuous Infusions: . sodium chloride 130 mL/hr at 05/15/15 0735    Assessment/Plan:  1. Lower GI bleed, likely diverticular in nature. Bleeding scan negative continues to have severe bleeding patient has decided against any further transfusions. I will continue normal saline for time being. If she starts having hypotension and has  significant bleed we will not be aggressive. She is in agreement with this plan 2. Relative hypotension hold blood pressure medications. 3. Hypothyroidism unspecified continue levothyroxine. 4. Anemia due to acute blood loss status post transfusion of multiple units of packed RBCs   Code Status: DO NOT RESUSCITATE    Code Status Orders        Start     Ordered   05/12/15 0558  Full code   Continuous     05/12/15 0557    Advance Directive Documentation        Most Recent Value   Type of Advance Directive  Healthcare Power of Attorney, Living will   Pre-existing out of facility DNR order (yellow form or pink MOST form)     "MOST" Form in Place?        Time spent: 35 minutes  Ivi Griffith, [place] [facility]

## 2015-05-15 NOTE — Progress Notes (Signed)
Pt given 1mg  of morphine IV and EKG completed (which stated sinus tachycardia) and sent to Dr. Verdell Carmine in ED. Nitro has not been given d/t waiting for pharm to send up with it being out in pyxis.  Pt felt relief from morphine and declined nitro at this time.  Pt stated that she noticed that she gets the chest pain after getting turned when getting changed in the bed.  Will cont to monitor pt.

## 2015-05-15 NOTE — Care Management Important Message (Signed)
Important Message  Patient Details  Name: Lisa Fitzgerald MRN: [id number] Date of Birth: 04-Aug-1918   Medicare Important Message Given:  Yes-second notification given    Darius Bump Allmond 05/15/2015, 11:23 AM

## 2015-05-15 NOTE — Progress Notes (Signed)
   05/15/15 0745  Clinical Encounter Type  Visited With Patient  Visit Type Follow-up  Consult/Referral To Chaplain  Spiritual Encounters  Spiritual Needs Prayer;Emotional  Stress Factors  Patient Stress Factors Exhausted;Health changes  Follow up visit with patient who was in good spirits. Spoke of dreams with loved ones in heaven. Provided spiritual care & prayer. Chap. Kaeleigh [place] G. [place]

## 2015-05-15 NOTE — Progress Notes (Signed)
Dr. Verdell Carmine notified d/t pt complaining of chest pain that radiates down to left elbow that started 10 minutes ago. VSS. No SOB noted.  MD ordered stat EKG, 1mg  morphine once, and nitro oral.  Will cont to monitor pt.

## 2015-05-16 DIAGNOSIS — I714 Abdominal aortic aneurysm, without rupture: Secondary | ICD-10-CM

## 2015-05-16 DIAGNOSIS — Z66 Do not resuscitate: Secondary | ICD-10-CM

## 2015-05-16 DIAGNOSIS — I2510 Atherosclerotic heart disease of native coronary artery without angina pectoris: Secondary | ICD-10-CM

## 2015-05-16 DIAGNOSIS — I200 Unstable angina: Secondary | ICD-10-CM

## 2015-05-16 DIAGNOSIS — Z853 Personal history of malignant neoplasm of breast: Secondary | ICD-10-CM

## 2015-05-16 DIAGNOSIS — R531 Weakness: Secondary | ICD-10-CM

## 2015-05-16 DIAGNOSIS — Z87891 Personal history of nicotine dependence: Secondary | ICD-10-CM

## 2015-05-16 DIAGNOSIS — K922 Gastrointestinal hemorrhage, unspecified: Secondary | ICD-10-CM

## 2015-05-16 DIAGNOSIS — K5791 Diverticulosis of intestine, part unspecified, without perforation or abscess with bleeding: Secondary | ICD-10-CM

## 2015-05-16 DIAGNOSIS — F0390 Unspecified dementia without behavioral disturbance: Secondary | ICD-10-CM

## 2015-05-16 DIAGNOSIS — R5383 Other fatigue: Secondary | ICD-10-CM

## 2015-05-16 DIAGNOSIS — J449 Chronic obstructive pulmonary disease, unspecified: Secondary | ICD-10-CM

## 2015-05-16 DIAGNOSIS — Z515 Encounter for palliative care: Secondary | ICD-10-CM

## 2015-05-16 DIAGNOSIS — Z79899 Other long term (current) drug therapy: Secondary | ICD-10-CM

## 2015-05-16 DIAGNOSIS — I10 Essential (primary) hypertension: Secondary | ICD-10-CM

## 2015-05-16 LAB — CBC
HEMATOCRIT: 20.7 % — AB (ref 35.0–47.0)
HEMOGLOBIN: 6.8 g/dL — AB (ref 12.0–16.0)
MCH: 30.9 pg (ref 26.0–34.0)
MCHC: 32.8 g/dL (ref 32.0–36.0)
MCV: 94.4 fL (ref 80.0–100.0)
Platelets: 106 10*3/uL — ABNORMAL LOW (ref 150–440)
RBC: 2.19 MIL/uL — ABNORMAL LOW (ref 3.80–5.20)
RDW: 15.3 % — AB (ref 11.5–14.5)
WBC: 17.4 10*3/uL — ABNORMAL HIGH (ref 3.6–11.0)

## 2015-05-16 LAB — BASIC METABOLIC PANEL
Anion gap: 2 — ABNORMAL LOW (ref 5–15)
BUN: 43 mg/dL — ABNORMAL HIGH (ref 6–20)
CALCIUM: 7.6 mg/dL — AB (ref 8.9–10.3)
CHLORIDE: 118 mmol/L — AB (ref 101–111)
CO2: 20 mmol/L — AB (ref 22–32)
CREATININE: 1.46 mg/dL — AB (ref 0.44–1.00)
GFR calc Af Amer: 34 mL/min — ABNORMAL LOW (ref >60)
GFR calc non Af Amer: 29 mL/min — ABNORMAL LOW (ref >60)
GLUCOSE: 137 mg/dL — AB (ref 65–99)
Potassium: 4.4 mmol/L (ref 3.5–5.1)
Sodium: 140 mmol/L (ref 135–145)

## 2015-05-16 MED ORDER — MORPHINE SULFATE (PF) 2 MG/ML IV SOLN
2.0000 mg | INTRAVENOUS | Status: DC | PRN
  Administered 2015-05-16 – 2015-05-18 (×4): 2 mg via INTRAVENOUS
  Filled 2015-05-16 (×4): qty 1

## 2015-05-16 MED ORDER — SALINE SPRAY 0.65 % NA SOLN
1.0000 | NASAL | Status: DC | PRN
  Administered 2015-05-18: 1 via NASAL
  Filled 2015-05-16: qty 44

## 2015-05-16 MED ORDER — FLUTICASONE PROPIONATE 50 MCG/ACT NA SUSP
2.0000 | Freq: Every day | NASAL | Status: DC
  Administered 2015-05-16 – 2015-05-18 (×3): 2 via NASAL
  Filled 2015-05-16: qty 16

## 2015-05-16 NOTE — Progress Notes (Signed)
PT Cancellation Note  Patient Details Name: Lisa Fitzgerald MRN: [id number] DOB: Feb 06, 1918   Cancelled Treatment:    Reason Eval/Treat Not Completed: Medical issues which prohibited therapy (Hg 6.8 05/16/15).  Hg <7.1 PT contraindicated.  Per chart, pt "has decided against any further transfusions".  Pt does not appear medically appropriate for physical therapy.     [name] 05/16/2015, 12:46 PM [name], [name]

## 2015-05-16 NOTE — Progress Notes (Signed)
Palliative Care Update   I have initiated Palliative Care Consult.  However, it is late in the day, and I will have to return in the am to complete the initial consult.  I see that pt has decided on no further transfusions and that the goal of care is 'mostly comfort'  I will try to help with plans for her comfort care outside of this hospitalization in the morning.  She is DNR status.    Kirby Funk, MD

## 2015-05-16 NOTE — Progress Notes (Signed)
Patient ID: Fradel Baldonado, female   DOB: 19-May-1918, 81 y.o.   MRN: [id number] [facility] PROGRESS NOTE  PCP: No primary care provider on file.  HPI/Subjective:   Patient's hemoglobin has drifted further had some episodes of bloody bowel movement earlier patient also started now complaining of chest pain and shortness of breath   Objective: Filed Vitals:   05/16/15 1007  BP: 124/73  Pulse: 106  Temp:   Resp:     Intake/Output Summary (Last 24 hours) at 05/16/15 1205 Last data filed at 05/16/15 1017  Gross per 24 hour  Intake 3339.08 ml  Output      0 ml  Net 3339.08 ml   Filed Weights   05/15/15 0019 05/16/15 0040 05/16/15 0500  Weight: 81.647 kg (180 lb) 83.643 kg (184 lb 6.4 oz) 85.639 kg (188 lb 12.8 oz)    ROS: Review of Systems  Constitutional: Negative for fever and chills.  Eyes: Negative for blurred vision.  Respiratory: Negative for cough and positive shortness of breath.   Cardiovascular: Positive chest pain.  Gastrointestinal: Positive for abdominal pain, diarrhea and blood in stool. Negative for nausea, vomiting and constipation.  Genitourinary: Negative for dysuria.  Musculoskeletal: Negative for joint pain.  Neurological: Negative for dizziness and headaches.   Exam: Physical Exam  Constitutional: She is oriented to person, place, and time.  HENT:  Nose: No mucosal edema.  Mouth/Throat: No oropharyngeal exudate or posterior oropharyngeal edema.  Eyes: Conjunctivae, EOM and lids are normal. Pupils are equal, round, and reactive to light.  Neck: No JVD present. Carotid bruit is not present. No edema present. No thyroid mass and no thyromegaly present.  Cardiovascular: S1 normal and S2 normal.  Exam reveals no gallop.   Murmur heard.  Systolic murmur is present with a grade of 4/6  Pulses:      Dorsalis pedis pulses are 2+ on the right side, and 2+ on the left side.  Respiratory: No respiratory distress. She has no wheezes. She has no  rhonchi. She has no rales.  GI: Soft. Bowel sounds are normal. There is no tenderness.  Musculoskeletal:       Right ankle: She exhibits no swelling.       Left ankle: She exhibits no swelling.  Lymphadenopathy:    She has no cervical adenopathy.  Neurological: She is alert and oriented to person, place, and time. No cranial nerve deficit.  Skin: Skin is warm. No rash noted. Nails show no clubbing.  Psychiatric: She has a normal mood and affect.    Data Reviewed: Basic Metabolic Panel:  Recent Labs Lab 05/12/15 0159 05/13/15 0222 05/14/15 0202 05/15/15 0908 05/16/15 0446  NA 136 139 139 142 140  K 4.0 4.4 4.6 4.8 4.4  CL 103 107 110 116* 118*  CO2 24 26 23  21* 20*  GLUCOSE 143* 102* 134* 145* 137*  BUN 30* 30* 33* 41* 43*  CREATININE 1.60* 1.23* 1.25* 1.68* 1.46*  CALCIUM 9.1 8.5* 8.2* 8.0* 7.6*   Liver Function Tests:  Recent Labs Lab 05/12/15 0159  AST 26  ALT 11*  ALKPHOS 59  BILITOT 0.5  PROT 5.7*  ALBUMIN 3.1*   CBC:  Recent Labs Lab 05/12/15 0159  05/13/15 0222 05/13/15 1017 05/14/15 0202 05/14/15 0934 05/15/15 0908 05/16/15 0446  WBC 8.3  --  6.7  --  11.3*  --  19.0* 17.4*  NEUTROABS 5.8  --   --   --   --   --  15.2*  --  HGB 11.2*  < > 8.6* 9.0* 9.8* 9.5* 9.8* 6.8*  HCT 33.4*  --  25.8*  --  29.6*  --  29.8* 20.7*  MCV 94.7  --  93.9  --  88.4  --  92.2 94.4  PLT 204  --  161  --  137*  --  117* 106*  < > = values in this interval not displayed.   Studies: No results found.  Scheduled Meds: . budesonide-formoterol  2 puff Inhalation BID  . fluticasone  2 spray Each Nare Daily  . levothyroxine  50 mcg Oral QAC breakfast  . magnesium oxide  400 mg Oral BID   Continuous Infusions:    Assessment/Plan:  1. Lower GI bleed, likely diverticular in nature. Bleeding scan negative continues to have  bleeding patient has decided against any further transfusions.  Patient's hemoglobin dropping further. 2. Relative hypotension hold blood  pressure medications on hold 3. Hypothyroidism unspecified continue levothyroxine. 4. Anemia due to acute blood loss status post transfusion of multiple units of packed RBCs no further blood 5. Chest pain shortness of breath likely due to the possible angina related to severe anemia and underlying coronary artery disease we'll start her on morphine Goal of treatment now is mainly comfort.   Code Status: DO NOT RESUSCITATE    Code Status Orders        Start     Ordered   05/12/15 0558  Full code   Continuous     05/12/15 0557    Advance Directive Documentation        Most Recent Value   Type of Advance Directive  Healthcare Power of Attorney, Living will   Pre-existing out of facility DNR order (yellow form or pink MOST form)     "MOST" Form in Place?        Time spent: 25 minutes  Alexyia Guarino, [place] [facility]

## 2015-05-17 DIAGNOSIS — K922 Gastrointestinal hemorrhage, unspecified: Secondary | ICD-10-CM | POA: Insufficient documentation

## 2015-05-17 LAB — CBC
HEMATOCRIT: 20 % — AB (ref 35.0–47.0)
HEMOGLOBIN: 6.5 g/dL — AB (ref 12.0–16.0)
MCH: 31.2 pg (ref 26.0–34.0)
MCHC: 32.6 g/dL (ref 32.0–36.0)
MCV: 96 fL (ref 80.0–100.0)
Platelets: 110 10*3/uL — ABNORMAL LOW (ref 150–440)
RBC: 2.08 MIL/uL — AB (ref 3.80–5.20)
RDW: 15.8 % — ABNORMAL HIGH (ref 11.5–14.5)
WBC: 11.2 10*3/uL — ABNORMAL HIGH (ref 3.6–11.0)

## 2015-05-17 NOTE — Progress Notes (Signed)
Patient ID: Lisa Fitzgerald, female   DOB: 07-Jan-1918, 81 y.o.   MRN: [id number] [facility] PROGRESS NOTE  PCP: No primary care provider on file.  HPI/Subjective:  Patient continues to have bleeding complaining of some chest pain and shortness of breath   Objective: Filed Vitals:   05/17/15 0802  BP: 86/65  Pulse: 89  Temp: 97.8 F (36.6 C)  Resp: 17    Intake/Output Summary (Last 24 hours) at 05/17/15 1249 Last data filed at 05/17/15 0915  Gross per 24 hour  Intake    660 ml  Output      0 ml  Net    660 ml   Filed Weights   05/16/15 0040 05/16/15 0500 05/17/15 0617  Weight: 83.643 kg (184 lb 6.4 oz) 85.639 kg (188 lb 12.8 oz) 85.639 kg (188 lb 12.8 oz)    ROS: Review of Systems  Constitutional: Negative for fever and chills.  Eyes: Negative for blurred vision.  Respiratory: Negative for cough and positive shortness of breath.   Cardiovascular: Positive chest pain.  Gastrointestinal: Positive for abdominal pain, diarrhea and blood in stool. Negative for nausea, vomiting and constipation.  Genitourinary: Negative for dysuria.  Musculoskeletal: Negative for joint pain.  Neurological: Negative for dizziness and headaches.   Exam: Physical Exam  Constitutional: She is oriented to person, place, and time.  HENT:  Nose: No mucosal edema.  Mouth/Throat: No oropharyngeal exudate or posterior oropharyngeal edema.  Eyes: Conjunctivae, EOM and lids are normal. Pupils are equal, round, and reactive to light.  Neck: No JVD present. Carotid bruit is not present. No edema present. No thyroid mass and no thyromegaly present.  Cardiovascular: S1 normal and S2 normal.  Exam reveals no gallop.   Murmur heard.  Systolic murmur is present with a grade of 4/6  Pulses:      Dorsalis pedis pulses are 2+ on the right side, and 2+ on the left side.  Respiratory: No respiratory distress. She has no wheezes. She has no rhonchi. She has no rales.  GI: Soft. Bowel sounds are  normal. There is no tenderness.  Musculoskeletal:       Right ankle: She exhibits no swelling.       Left ankle: She exhibits no swelling.  Lymphadenopathy:    She has no cervical adenopathy.  Neurological: She is alert and oriented to person, place, and time. No cranial nerve deficit.  Skin: Skin is warm. No rash noted. Nails show no clubbing.  Psychiatric: She has a normal mood and affect.    Data Reviewed: Basic Metabolic Panel:  Recent Labs Lab 05/12/15 0159 05/13/15 0222 05/14/15 0202 05/15/15 0908 05/16/15 0446  NA 136 139 139 142 140  K 4.0 4.4 4.6 4.8 4.4  CL 103 107 110 116* 118*  CO2 24 26 23  21* 20*  GLUCOSE 143* 102* 134* 145* 137*  BUN 30* 30* 33* 41* 43*  CREATININE 1.60* 1.23* 1.25* 1.68* 1.46*  CALCIUM 9.1 8.5* 8.2* 8.0* 7.6*   Liver Function Tests:  Recent Labs Lab 05/12/15 0159  AST 26  ALT 11*  ALKPHOS 59  BILITOT 0.5  PROT 5.7*  ALBUMIN 3.1*   CBC:  Recent Labs Lab 05/12/15 0159  05/13/15 0222  05/14/15 0202 05/14/15 0934 05/15/15 0908 05/16/15 0446 05/17/15 0507  WBC 8.3  --  6.7  --  11.3*  --  19.0* 17.4* 11.2*  NEUTROABS 5.8  --   --   --   --   --  15.2*  --   --  HGB 11.2*  < > 8.6*  < > 9.8* 9.5* 9.8* 6.8* 6.5*  HCT 33.4*  --  25.8*  --  29.6*  --  29.8* 20.7* 20.0*  MCV 94.7  --  93.9  --  88.4  --  92.2 94.4 96.0  PLT 204  --  161  --  137*  --  117* 106* 110*  < > = values in this interval not displayed.   Studies: No results found.  Scheduled Meds: . budesonide-formoterol  2 puff Inhalation BID  . fluticasone  2 spray Each Nare Daily  . levothyroxine  50 mcg Oral QAC breakfast  . magnesium oxide  400 mg Oral BID   Continuous Infusions:    Assessment/Plan:  1. Lower GI bleed, likely diverticular in nature. Patient is not interested in any blood transfusion. No interventions 2. Hypotension: Monitor blood pressure 3. Hypothyroidism unspecified continue levothyroxine. 4. Anemia due to acute blood loss status  post transfusion of multiple units of packed RBCs no further blood 5. Chest pain shortness of breath likely due to the possible angina related to severe anemia and underlying coronary artery disease we'll start her on morphine 6. AAA: 7.2 cm in size risk of rupture very high    I have discussed the discharge with daughter and the patient. Presented them option for home with hospice or hospice home. Patient will be evaluated by hospice home for possible transfer with her continues GI bleed and hypotension and abdominal aortic aneurysm which is severely enlarged patient's life expectancy is very short.   Code Status: DO NOT RESUSCITATE    Code Status Orders        Start     Ordered   05/12/15 0558  Full code   Continuous     05/12/15 0557    Advance Directive Documentation        Most Recent Value   Type of Advance Directive  Healthcare Power of Attorney, Living will   Pre-existing out of facility DNR order (yellow form or pink MOST form)     "MOST" Form in Place?        Time spent: 25 minutes  Cyree Chuong, [place] [facility]

## 2015-05-17 NOTE — Progress Notes (Signed)
Palliative Care Update  After several involved meetings with various family members, a plan was devised for pt to go HOME with HOSPICE in the HOME of pt's daughter.   However, things changed.   Late in the day, pts family wanted to change plans.  There was some initial miscommunication about what they wanted, as they were not using the right wording.  But, after meeting with the patient's daughter and grandson, I now understand what they are asking about.    They would like to have patient go to a LONG TERM CARE (non rehab, non therapy) bed in a [place] and while there, she can have Hospice (they have the funds to pay privately for this for about 3 or 4 months or so).  This is due to some facts about the pts daughter's health that her children were worried about. The daughter's children (pts two adult grandchildren) did not feel that their mother would really be able to care well for their grandmother AND they feel the cost will be well worth it.    They understand that the patient is not a rehab candidate, with no real ability to participate in therapy with low blood pressure, low hemoglobin, and refusal of PT.  Patient has stated several times that she is ready to 'go' --meaning ready to die. She does not want more blood transfusions and she is fine with having Hospice care.    I will convey this change in plans tomorrow to care mgr, pt's attending, etc.  Full note to follow.  Colleen Can, MD

## 2015-05-17 NOTE — Progress Notes (Signed)
PT Cancellation Note  Patient Details Name: Lisa Fitzgerald MRN: [id number] DOB: 04/27/1918   Cancelled Treatment:    Reason Eval/Treat Not Completed: Medical issues which prohibited therapy (Per chart review, patient noted with HgB that continues to trend downward (6.5 this date), hypotensive.  Pending full palliative care consult; per chart, appears goals are largely comfort care at this point.  Will hold PT services until palliative care care consult complete and overall goals of care established.)  [name] H. Owens Shark, PT, DPT, NCS 05/17/2015, 8:51 AM [phone]

## 2015-05-17 NOTE — Progress Notes (Signed)
   05/17/15 0740  Clinical Encounter Type  Visited With Patient  Visit Type Follow-up;Spiritual support  Referral From Nurse  Consult/Referral To Chaplain  Spiritual Encounters  Spiritual Needs Prayer  Stress Factors  Patient Stress Factors Exhausted;Health changes  Met w/patient to provide pastoral care & prayer. Chap. Lon Klippel G. [place]

## 2015-05-17 NOTE — Clinical Social Work Note (Signed)
CSW spoke to Georgiann Cocker, Adult nurse at [facility] and provided an update.  CSW explained that a palliative care consult has been initiated.  CSW will continue to update the facility, follow pt and assist with d/c planning needs.  [place], [place]

## 2015-05-17 NOTE — Care Management (Signed)
Have spoke with the patients daughter and offered choice of Hospice providers .  Daughter stated that she would like to go with [facility] and [facility] countly. Have discussed case with Dr Megan Salon  palliative care who feels patient is appropriate for home with hospice at this point. I have contacted Flo Shanks at [facility] who will meet with family to discuss plan.

## 2015-05-17 NOTE — Consult Note (Signed)
Palliative Medicine Inpatient Consult Note   Name: Lisa Fitzgerald Date: 05/17/2015 MRN: [id number]  DOB: [birth year]  Referring Physician: Dustin Flock, MD  Palliative Care consult requested for this 81 y.o. female for goals of medical therapy in patient with intractable bleeding.  She has told her doctor she does not want any more transfusions. At this time, it appears that her GI bleeding is slowing.  But she is thought to be a candidate for Hospice.  I am asked to establish goals of care and best setting for her care [facility] vs in a facility vs in a home setting etc).   TODAY'S CONVERSATIONS, EVENTS, AND PLAN: I see that pt has decided on no further transfusions and that the goal of care is 'mostly comfort' I will try to help with plans for her comfort care outside of this hospitalization in the morning. She is DNR status.  It is very late in the day and I must return to speak with family.  Pt is certainly appropriate for Hospice, but we have to determine if she is actively dying or if she would be more appropriate for longer term [place] (in a facility vs home setting for instance).    REVIEW OF SYSTEMS:  Patient is not able to provide ROS due to illness and weakness and exhaustion from the day  SPIRITUAL SUPPORT SYSTEM: Yes --family is supportive (daughter and son).  SOCIAL HISTORY:  reports that she has quit smoking. She does not have any smokeless tobacco history on file. She reports that she does not drink alcohol or use illicit drugs. She lived with her daughter for about 10 years until approx two mos ago when she went to live in an ALF.  LEGAL DOCUMENTS:  None in hard chart  CODE STATUS: DNR  PAST MEDICAL HISTORY: Past Medical History  Diagnosis Date  . Hypertension   . COPD (chronic obstructive pulmonary disease)   . Coronary artery disease   . Thyroid disease   . Cancer     PAST SURGICAL HISTORY:  Past Surgical History  Procedure Laterality Date  . Breast  surgery    . Cholecystectomy    . Mastectomy    . Mastectomy      30 years ago  . Hemorrhoid surgery      ALLERGIES:  is allergic to penicillins.  MEDICATIONS:  Current Facility-Administered Medications  Medication Dose Route Frequency Provider Last Rate Last Dose  . budesonide-formoterol (SYMBICORT) 160-4.5 MCG/ACT inhaler 2 puff  2 puff Inhalation BID Harrie Foreman, MD   2 puff at 05/17/15 0802  . fluticasone (FLONASE) 50 MCG/ACT nasal spray 2 spray  2 spray Each Nare Daily Dustin Flock, MD   2 spray at 05/17/15 1122  . levothyroxine (SYNTHROID, LEVOTHROID) tablet 50 mcg  50 mcg Oral QAC breakfast Harrie Foreman, MD   50 mcg at 05/17/15 0802  . magnesium oxide (MAG-OX) tablet 400 mg  400 mg Oral BID Harrie Foreman, MD   400 mg at 05/17/15 1122  . morphine 2 MG/ML injection 2 mg  2 mg Intravenous Q4H PRN Dustin Flock, MD   2 mg at 05/17/15 0028  . nitroGLYCERIN (NITROSTAT) SL tablet 0.4 mg  0.4 mg Sublingual Q5 min PRN Henreitta Leber, MD      . ondansetron [facility]) tablet 4 mg  4 mg Oral Q6H PRN Harrie Foreman, MD       Or  . ondansetron [facility]) injection 4 mg  4 mg Intravenous Q6H  PRN Harrie Foreman, MD   4 mg at 05/16/15 1844  . sodium chloride (OCEAN) 0.65 % nasal spray 1 spray  1 spray Each Nare PRN Dustin Flock, MD        Vital Signs: BP 86/65 mmHg  Pulse 89  Temp(Src) 97.8 F (36.6 C) (Oral)  Resp 17  Ht 5\' 5"  (1.651 m)  Wt 85.639 kg (188 lb 12.8 oz)  BMI 31.42 kg/m2  SpO2 100% Filed Weights   05/16/15 0040 05/16/15 0500 05/17/15 0617  Weight: 83.643 kg (184 lb 6.4 oz) 85.639 kg (188 lb 12.8 oz) 85.639 kg (188 lb 12.8 oz)    Estimated body mass index is 31.42 kg/(m^2) as calculated from the following:   Height as of this encounter: 5\' 5"  (1.651 m).   Weight as of this encounter: 85.639 kg (188 lb 12.8 oz).  PERFORMANCE STATUS (ECOG) : 3 - Symptomatic, >50% confined to bed  PHYSICAL EXAM: NAD  Resting comfortably EOMI Heart rrr no  mgr Lungs cta no use of access muscles Abd is soft with BS Skin pale warm and dry    IMPRESSION: 1.  Intractable GI bleeding ---most likely diverticular --slowing and possibly stopping now ---with some degree of hypotension improved with fluids --with some nausea now improved 2.  Large infrarenal abd aortic aneurysm ---risk of rupture anytime precludes physical therapy  3.  Advanced age 1.  COPD 5   CAD with intermittent unstable angina (chest pain about once or twice a day) 6.  Hypothyroidsism 7.  Dementia (mild ---repetetive speech) 8.  Breast Cancer --s/p mastectomy    See top of note for plan  More than 50% of the visit was spent in counseling/coordination of care: Yes  Time Spent: 50 minutes

## 2015-05-18 DIAGNOSIS — I714 Abdominal aortic aneurysm, without rupture, unspecified: Secondary | ICD-10-CM | POA: Insufficient documentation

## 2015-05-18 DIAGNOSIS — I959 Hypotension, unspecified: Secondary | ICD-10-CM | POA: Insufficient documentation

## 2015-05-18 MED ORDER — NITROGLYCERIN 0.4 MG SL SUBL: 0.4000 mg | SUBLINGUAL_TABLET | SUBLINGUAL | Status: AC | PRN

## 2015-05-18 MED ORDER — MORPHINE SULFATE (CONCENTRATE) 10 MG/0.5ML PO SOLN: 5.0000 mg | Freq: Two times a day (BID) | ORAL | Status: DC

## 2015-05-18 MED ORDER — LEVOTHYROXINE SODIUM 50 MCG PO TABS: 50.0000 ug | ORAL_TABLET | Freq: Every day | ORAL | Status: AC

## 2015-05-18 MED ORDER — TRIAMCINOLONE ACETONIDE 55 MCG/ACT NA AERO: 2.0000 | INHALATION_SPRAY | Freq: Every day | NASAL | Status: AC

## 2015-05-18 MED ORDER — SALINE SPRAY 0.65 % NA SOLN: 1.0000 | NASAL | Status: AC | PRN

## 2015-05-18 MED ORDER — MORPHINE SULFATE (CONCENTRATE) 10 MG /0.5 ML PO SOLN: 5.0000 mg | ORAL | Status: DC | PRN

## 2015-05-18 MED ORDER — SENNA 8.6 MG PO TABS: 2.0000 | ORAL_TABLET | Freq: Every evening | ORAL | Status: AC | PRN

## 2015-05-18 MED ORDER — POLYETHYLENE GLYCOL 3350 17 G PO PACK: 17.0000 g | PACK | Freq: Every day | ORAL | Status: AC | PRN

## 2015-05-18 MED ORDER — MORPHINE SULFATE (CONCENTRATE) 10 MG/0.5ML PO SOLN
5.0000 mg | ORAL | Status: DC | PRN
  Filled 2015-05-18: qty 1

## 2015-05-18 MED ORDER — MORPHINE SULFATE (CONCENTRATE) 10 MG /0.5 ML PO SOLN: 5.0000 mg | Freq: Two times a day (BID) | ORAL | Status: AC

## 2015-05-18 MED ORDER — LORAZEPAM 0.5 MG PO TABS: 0.5000 mg | ORAL_TABLET | Freq: Four times a day (QID) | ORAL | Status: AC | PRN

## 2015-05-18 MED ORDER — AZELASTINE HCL 0.1 % NA SOLN
2.0000 | Freq: Two times a day (BID) | NASAL | Status: DC
  Filled 2015-05-18: qty 30

## 2015-05-18 MED ORDER — PROCHLORPERAZINE 25 MG RE SUPP: 25.0000 mg | Freq: Two times a day (BID) | RECTAL | Status: AC | PRN

## 2015-05-18 MED ORDER — MORPHINE SULFATE (CONCENTRATE) 10 MG/0.5ML PO SOLN
2.5000 mg | Freq: Two times a day (BID) | ORAL | Status: DC
  Administered 2015-05-18: 2.6 mg via ORAL

## 2015-05-18 MED ORDER — LORAZEPAM 0.5 MG PO TABS: 0.5000 mg | ORAL_TABLET | Freq: Four times a day (QID) | ORAL | Status: DC | PRN

## 2015-05-18 NOTE — Progress Notes (Signed)
Palliative Medicine Inpatient Consult Follow Up Note   Name: Lisa Fitzgerald Date: 05/18/2015 MRN: [id number]  DOB: [birth year]  Referring Physician: Dustin Flock, MD  Palliative Care consult requested for this 81 y.o. female for goals of medical therapy in patient with severe bleeding from rectum.  She has asked for no further transfusions.  She has a mild dementia(diagnosed here based on historical reports of her repetetive speech and short term recall limitations),  but is able to state her wishes for only comfort care.  The patient and family had decided on pt going to the home of patient's daughter, but this plan changed late today when pt's grandchildren became very concerned about pts daughter being able to manage pt due to some health issues of her own.  Family states that they have the financial resources to pay for pt to go to [employer] bed in a skilled care facility and they do want Hospice.  Today's Conversations, Events, and Plans:  1.  Pt continues with DNR status.  Armandina Gemma DNR is in paper chart.  2.  Plan is changed from hospice in the home to Hospice in the [place] bed at a [place] (NOT "REHAB').  [facility] selected [facility].   3.  I spent 3.5 hours total in coordination of care and family conferences today throughout the day.  I have had numerous conversations with nursing, attending, family, pt, and care mgr.  I do feel this is a good plan for the patient and family.  4.  IF pt has a recurrence of bleeding, the plan will change to having her go to [facility].  So far, no further bleeding has been noted.     REVIEW OF SYSTEMS:  She feels much better She still complains of chest pain and arm pain about twice a day. She says it starts in her left hand and goes up her left arm into her neck and then sometimes is also felt in right arm.  She describes it as feeling 'like a menstrual period and not like a sharp pain'.  No further blood from rectum  today.  CODE STATUS: DNR   PAST MEDICAL HISTORY: Past Medical History  Diagnosis Date  . Hypertension   . COPD (chronic obstructive pulmonary disease)   . Coronary artery disease   . Thyroid disease   . Cancer     PAST SURGICAL HISTORY:  Past Surgical History  Procedure Laterality Date  . Breast surgery    . Cholecystectomy    . Mastectomy    . Mastectomy      30 years ago  . Hemorrhoid surgery      Vital Signs: BP 110/60 mmHg  Pulse 90  Temp(Src) 97.9 F (36.6 C) (Oral)  Resp 18  Ht 5\' 5"  (1.651 m)  Wt 85.548 kg (188 lb 9.6 oz)  BMI 31.38 kg/m2  SpO2 98% Filed Weights   05/16/15 0500 05/17/15 0617 05/18/15 0451  Weight: 85.639 kg (188 lb 12.8 oz) 85.639 kg (188 lb 12.8 oz) 85.548 kg (188 lb 9.6 oz)    Estimated body mass index is 31.38 kg/(m^2) as calculated from the following:   Height as of this encounter: 5\' 5"  (1.651 m).   Weight as of this encounter: 85.548 kg (188 lb 9.6 oz).  PHYSICAL EXAM: Alert with mild confusion and confabulation EOMI OP clear Neck w/o JVD or thyromegaly Hrt rrr --today I hear a 2-3/ 6 HSM best heard in 2nd right IC space Lungs  cta Abd soft and nontender today Sacrum --no breakdown seen Ext no cyanosis or edema Skin pale but warm and dry  LABS: CBC:    Component Value Date/Time   WBC 11.2* 05/17/2015 0507   WBC 8.5 02/01/2014 1638   HGB 6.5* 05/17/2015 0507   HGB 13.8 02/01/2014 1638   HCT 20.0* 05/17/2015 0507   HCT 42.2 02/01/2014 1638   PLT 110* 05/17/2015 0507   PLT 235 02/01/2014 1638   MCV 96.0 05/17/2015 0507   MCV 96 02/01/2014 1638   NEUTROABS 15.2* 05/15/2015 0908   NEUTROABS 5.7 02/01/2014 1638   LYMPHSABS 1.9 05/15/2015 0908   LYMPHSABS 1.5 02/01/2014 1638   MONOABS 1.7* 05/15/2015 0908   MONOABS 1.0* 02/01/2014 1638   EOSABS 0.0 05/15/2015 0908   EOSABS 0.2 02/01/2014 1638   BASOSABS 0.1 05/15/2015 0908   BASOSABS 0.0 02/01/2014 1638   Comprehensive Metabolic Panel:    Component Value Date/Time    NA 140 05/16/2015 0446   NA 137 02/01/2014 1638   K 4.4 05/16/2015 0446   K 3.9 02/01/2014 1638   CL 118* 05/16/2015 0446   CL 104 02/01/2014 1638   CO2 20* 05/16/2015 0446   CO2 28 02/01/2014 1638   BUN 43* 05/16/2015 0446   BUN 28* 02/01/2014 1638   CREATININE 1.46* 05/16/2015 0446   CREATININE 1.47* 02/01/2014 1638   GLUCOSE 137* 05/16/2015 0446   GLUCOSE 140* 02/01/2014 1638   CALCIUM 7.6* 05/16/2015 0446   CALCIUM 9.1 02/01/2014 1638   AST 26 05/12/2015 0159   AST 24 02/01/2014 1638   ALT 11* 05/12/2015 0159   ALT 13 02/01/2014 1638   ALKPHOS 59 05/12/2015 0159   ALKPHOS 68 02/01/2014 1638   BILITOT 0.5 05/12/2015 0159   BILITOT 0.4 02/01/2014 1638   PROT 5.7* 05/12/2015 0159   PROT 6.8 02/01/2014 1638   ALBUMIN 3.1* 05/12/2015 0159   ALBUMIN 3.4 02/01/2014 1638   TESTS: Nuclear Medicine GI blood loss scan 05/13/15: No scintigraphic evidence of GI bleeding. Abnormal collection of labeled erythrocytes in the upper mid abdomen LEFT of midline, persisting throughout exam, raising question of abdominal aortic aneurysm.   CT angio abd/ pelvis 05/14/15: --7.2 cm infrarenal abdominal aortic aneurysm with moderate mural plaque. No evidence of aortoenteric fistula. --Tight stenosis in the proximal right external iliac artery. Heavily calcified left renal artery, suspect hemodynamically significant stenosis. --Celiac artery and superior mesenteric artery are patent with moderate to advanced atherosclerotic calcifications. 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 --Diffuse colonic diverticulosis, most pronounced in the sigmoid colon. No evidence of active diverticulitis. --Small peripheral bilateral lower lobe pulmonary nodules,   IMPRESSION: 1. Blood per rectum --extensive but now stopped ---most likely diverticular given proven diverticulosis as seen on Ct (most pronounced in sigmoid colon) ---with some degree of hypotension improved with fluids ---with some  nausea now improved ---with severe post hemorrhagic anemia (Hgb around 6 and pt does not desire any further transfusions.  ---with volume depleted / circulatory shock --better after initial transfusion and fluids 2. Large( 7.2 cm)  infrarenal abd aortic aneurysm ---risk of rupture anytime precludes physical therapy  3. Advanced age 51. COPD 5 CAD  --with intermittent unstable angina (chest pain about once or twice a day) 6. Hypothyroidsism 7. Dementia (mild ---repetetive speech) 8. History of Breast Cancer --s/p mastectomy 9.  Mild malnutrition 10.  Mild Hyperglycemia 11.  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  REFERRALS TO BE ORDERED:  Hospice in a long term care facility Social Work to assist with long term care placement  More than 50% of the visit was spent in counseling/coordination of care: YES  Time Spent: 3 hrs 30 minutes today --several family conferences

## 2015-05-18 NOTE — Clinical Social Work Placement (Signed)
   CLINICAL SOCIAL WORK PLACEMENT  NOTE  Date:  05/18/2015  Patient Details  Name: Lisa Fitzgerald MRN: [id number] Date of Birth: 10/22/1917  Clinical Social Work is seeking post-discharge placement for this patient at the [place] level of care (*CSW will initial, date and re-position this form in  chart as items are completed):  Yes   Patient/family provided with [place] Work Department's list of facilities offering this level of care within the geographic area requested by the patient (or if unable, by the patient's family).  Yes   Patient/family informed of their freedom to choose among providers that offer the needed level of care, that participate in Medicare, Medicaid or managed care program needed by the patient, have an available bed and are willing to accept the patient.  Yes   Patient/family informed of [facility]'s ownership interest in [facility] and [facility], as well as of the fact that they are under no obligation to receive care at these facilities.  PASRR submitted to EDS on 05/18/15     PASRR number received on 05/18/15     Existing PASRR number confirmed on       FL2 transmitted to all facilities in geographic area requested by pt/family on 05/18/15     FL2 transmitted to all facilities within larger geographic area on       Patient informed that his/her managed care company has contracts with or will negotiate with certain facilities, including the following:        Yes   Patient/family informed of bed offers received.  Patient [facility] bed at [facility]     Physician recommends and patient chooses bed at      Patient to be transferred to [facility] on 05/18/15.  Patient to be transferred to facility by [facility]     Patient family notified on 05/18/15 of transfer.  Name of family member notified:  [place]     PHYSICIAN       Additional Comment:     _______________________________________________ Alonna Buckler, LCSW 05/18/2015, 4:11 PM

## 2015-05-18 NOTE — Progress Notes (Signed)
Palliative Medicine Inpatient Consult Follow Up Note   Name: Lisa Fitzgerald Date: 05/18/2015 MRN: [id number]  DOB: 06/26/1918  Referring Physician: Dustin Flock, MD  Palliative Care consult requested for this 81 y.o. female for goals of medical therapy in patient with Diverticular Bleeding, post-hemorrhagic anemia, severe PVD, CAD with unstable angina, large infrarenal abdominal aortic aneurysm at high risk of rupture at any time, COPD (emphysema without exacerbation), left renal artery stenosis, and weakness with borderline hypotension related to severe anemia (pt does not want any more transfusions).   Today's Conversations, Events, and Plans:  1.  DNR continues and [place] DNR form is in chart  2.  Social Worker is working on finding pt a bed offer for a [place] bed in a skilled facility setting (this is NOT A REHAB BED).  Hospice will be involved there.   3.  Reasons why she is not going back to her ALF:     --she doesn't walk from room to dining room with walker anymore     --family doesn't feel that she is appropriate for ALF any longer and is asking for skilled setting but long term care --they feel she will be largely bedbound until death occurs and this is likely correct.  4.  Reasons why she is not a 'rehab' or therapy candidate    ---She is unsafe for therapy due to the 7.2 cm infrarenal aortic aneurysm that is noted to be 'at high risk of rupture'.  Therapy could actually induce a rupture.    ---She has UNSTABLE ANGINA --and her angina occurs regularly with exertion and walking but also occurs at rest.  No treatment is planned, but this makes her inappropriate for therapy    ---She has a low hemoglobin and borderline to low blood pressure and no transfusions to fix this are planned    ---She has limited life expectancy and will be under Hospice care.  She has orders for morphine, ativan, etc--essentially comfort care only orders.  She is not expected to improve.  5.   Reasons for Hospice: ---Severe anemia ---Severe Coronary artery disease with unstable angina, probable CHF, probably aortic stenosis (heart murmur is consistent with this but no echo is planned)  ---Symptom control for angina which takes place daily or several times daily ---Advanced age and multiple co-morbid medical conditions ---Patient's willingness and desire for end of life comfort care  She is very pleasant and family is very pleaasant, appropriate and involved. Her skin is intact. She cooperates with care (but does not want therapy --appropriately).      REVIEW OF SYSTEMS:  She says she had chest pain twice last night She doesn't like to call it 'pain' and repeats often that it feels 'like that dead feeling you get when you are about to start your period'.  She describes it as starting in her left arm and going into her chest and chin and sometimes down right arm. She says it is her heart and that morphine seems to help it.     CODE STATUS: DNR   PAST MEDICAL HISTORY: Past Medical History  Diagnosis Date  . Hypertension   . COPD (chronic obstructive pulmonary disease)   . Coronary artery disease   . Thyroid disease   . Cancer     PAST SURGICAL HISTORY:  Past Surgical History  Procedure Laterality Date  . Breast surgery    . Cholecystectomy    . Mastectomy    . Mastectomy  30 years ago  . Hemorrhoid surgery      Vital Signs: BP 110/60 mmHg  Pulse 90  Temp(Src) 97.9 F (36.6 C) (Oral)  Resp 18  Ht 5\' 5"  (1.651 m)  Wt 85.548 kg (188 lb 9.6 oz)  BMI 31.38 kg/m2  SpO2 98% Filed Weights   05/16/15 0500 05/17/15 0617 05/18/15 0451  Weight: 85.639 kg (188 lb 12.8 oz) 85.639 kg (188 lb 12.8 oz) 85.548 kg (188 lb 9.6 oz)    Estimated body mass index is 31.38 kg/(m^2) as calculated from the following:   Height as of this encounter: 5\' 5"  (1.651 m).   Weight as of this encounter: 85.548 kg (188 lb 9.6 oz).  PHYSICAL EXAM: Alert Pleasant but confabulating  some EOMI OP clear Neck no JVD seen  Heart --has 2-7/5 Holosystolic murmur R 2nd intercostal space Lungs decreased BS bases Abd soft and nontender with nl Bowel sounds Ext no cyanosis or clubbing Skin pale warm dry and intact Neuro --mild confusion but pleasant     LABS: CBC:    Component Value Date/Time   WBC 11.2* 05/17/2015 0507   WBC 8.5 02/01/2014 1638   HGB 6.5* 05/17/2015 0507   HGB 13.8 02/01/2014 1638   HCT 20.0* 05/17/2015 0507   HCT 42.2 02/01/2014 1638   PLT 110* 05/17/2015 0507   PLT 235 02/01/2014 1638   MCV 96.0 05/17/2015 0507   MCV 96 02/01/2014 1638   NEUTROABS 15.2* 05/15/2015 0908   NEUTROABS 5.7 02/01/2014 1638   LYMPHSABS 1.9 05/15/2015 0908   LYMPHSABS 1.5 02/01/2014 1638   MONOABS 1.7* 05/15/2015 0908   MONOABS 1.0* 02/01/2014 1638   EOSABS 0.0 05/15/2015 0908   EOSABS 0.2 02/01/2014 1638   BASOSABS 0.1 05/15/2015 0908   BASOSABS 0.0 02/01/2014 1638   Comprehensive Metabolic Panel:    Component Value Date/Time   NA 140 05/16/2015 0446   NA 137 02/01/2014 1638   K 4.4 05/16/2015 0446   K 3.9 02/01/2014 1638   CL 118* 05/16/2015 0446   CL 104 02/01/2014 1638   CO2 20* 05/16/2015 0446   CO2 28 02/01/2014 1638   BUN 43* 05/16/2015 0446   BUN 28* 02/01/2014 1638   CREATININE 1.46* 05/16/2015 0446   CREATININE 1.47* 02/01/2014 1638   GLUCOSE 137* 05/16/2015 0446   GLUCOSE 140* 02/01/2014 1638   CALCIUM 7.6* 05/16/2015 0446   CALCIUM 9.1 02/01/2014 1638   AST 26 05/12/2015 0159   AST 24 02/01/2014 1638   ALT 11* 05/12/2015 0159   ALT 13 02/01/2014 1638   ALKPHOS 59 05/12/2015 0159   ALKPHOS 68 02/01/2014 1638   BILITOT 0.5 05/12/2015 0159   BILITOT 0.4 02/01/2014 1638   PROT 5.7* 05/12/2015 0159   PROT 6.8 02/01/2014 1638   ALBUMIN 3.1* 05/12/2015 0159   ALBUMIN 3.4 02/01/2014 1638    IMPRESSION: 1. Blood per rectum --extensive but now stopped ---most likely diverticular given proven diverticulosis as seen on Ct (most  pronounced in sigmoid colon) ---with some degree of hypotension improved with fluids ---with some nausea now improved ---with severe post hemorrhagic anemia (Hgb around 6 and pt does not desire any further transfusions.  ---with volume depleted / circulatory shock --better after initial transfusion and fluids 2. Large( 7.2 cm) infrarenal abd aortic aneurysm ---risk of rupture anytime precludes physical therapy  3. Advanced age 39. COPD 5 CAD  --with UNSTABLE ANGINA   -------worse with activity but also occurs at rest  ( arm and chest 'discomfort' about  twice a day ----Helped by low dose morphine) 6. Hypothyroidsism 7. Dementia (mild ---repetetive speech) 8. History of Breast Cancer --s/p mastectomy 9. Mild malnutrition 10. Mild Hyperglycemia 11. 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  14.  Heart Murmur --suspicious for aortic stenosis 15.  Elevated BNP   See top of note for plan  REFERRALS TO BE ORDERED:  Social Work --for placement in a long term care (non therapy, non rehab bed WITH Hospice). [facility] has already been selected by family  More than 50% of the visit was spent in counseling/coordination of care: YES  Time Spent: 30 min

## 2015-05-18 NOTE — Clinical Social Work Placement (Signed)
   CLINICAL SOCIAL WORK PLACEMENT  NOTE  Date:  05/18/2015  Patient Details  Name: Lisa Fitzgerald MRN: [id number] Date of Birth: [birth year]  Clinical Social Work is seeking post-discharge placement for this patient at the [place] level of care (*CSW will initial, date and re-position this form in  chart as items are completed):  Yes   Patient/family provided with [place] Work Department's list of facilities offering this level of care within the geographic area requested by the patient (or if unable, by the patient's family).  Yes   Patient/family informed of their freedom to choose among providers that offer the needed level of care, that participate in Medicare, Medicaid or managed care program needed by the patient, have an available bed and are willing to accept the patient.  Yes   Patient/family informed of [facility]'s ownership interest in [facility] and [facility], as well as of the fact that they are under no obligation to receive care at these facilities.  PASRR submitted to EDS on 05/18/15     PASRR number received on 05/18/15     Existing PASRR number confirmed on       FL2 transmitted to all facilities in geographic area requested by pt/family on 05/18/15     FL2 transmitted to all facilities within larger geographic area on       Patient informed that his/her managed care company has contracts with or will negotiate with certain facilities, including the following:        Yes   Patient/family informed of bed offers received.  Patient chooses bed at       Physician recommends and patient chooses bed at      Patient to be transferred to   on  .  Patient to be transferred to facility by       Patient family notified on   of transfer.  Name of family member notified:        PHYSICIAN       Additional Comment:    _______________________________________________ Naida Sleight, LCSW 05/18/2015, 1:37 PM

## 2015-05-18 NOTE — Progress Notes (Signed)
PT Cancellation Note  Patient Details Name: Lisa Fitzgerald MRN: [id number] DOB: 03/02/1918   Cancelled Treatment:    Reason Eval/Treat Not Completed: Medical issues which prohibited therapy (Patient continues with low HgB, hypotension, additional medical concerns.  Per notes, patient/family not interested in rehab/therapy at this time, preferring to pursue hospice services at this time.  Per discussion with Dr. Dustin Flock, okay to discontinue PT order at this time.  Please re-consult should goals/needs change.)   [name] H. Owens Shark, PT, DPT, NCS 05/18/2015, 10:07 AM [phone]

## 2015-05-18 NOTE — Care Management Important Message (Signed)
Important Message  Patient Details  Name: Lisa Fitzgerald MRN: [id number] Date of Birth: 1918/04/01   Medicare Important Message Given:  Yes-third notification given    Juliann Pulse A Allmond 05/18/2015, 1:40 PM

## 2015-05-18 NOTE — Clinical Social Work Note (Signed)
CSW spoke to pt's grandson and explained the referral process for SNF.  CSW initiated bed search, will extend bed offers once received and will continue to assist pt with d/c planning needs.  [place], [place]

## 2015-05-18 NOTE — Discharge Summary (Signed)
Lisa Fitzgerald, 81 y.o., DOB 1918-09-02, MRN [id number]. Admission date: 05/12/2015 Discharge Date 05/18/2015 Primary MD No primary care provider on file. Admitting Physician Harrie Foreman, MD  Admission Diagnosis  Hematochezia [K92.1] Lower GI bleed [K92.2] Hypotension, unspecified hypotension type [I95.9]  Discharge Diagnosis   Active Problems:   GI bleed   Bleeding gastrointestinal   Arterial hypotension   AAA (abdominal aortic aneurysm) without rupture  acute blood loss anemia due to diverticular Peripheral vascular disease Angina        Hospital Course  The patient presents emergency department via EMS from her assisted living facility following 3 bloody nonpainful stools. Patient was noted to have likely diverticular bleed. She continued to have significant amount of bleeding. Had a bleeding scan that was negative. It did show localization of blood suggestive of abdominal aortic aneurysm. She had CT scan of the abdomen which showed 7.2 cm abdominal aortic aneurysm. Patient did receive multiple units of blood continued to have blood per rectum. She finally decided that she did not want any more blood transfusions did not want any aggressive therapy. She was seen by palliative care team and is being arranged to be discharged to a skilled nursing facility with hospice following her there.  Family patient and the caregivers are in agreement that patient should not be brought back to the hospital. She and she does not want any intervention or any transfusion. She also has multiple other comorbidities including a very large abdominal aortic aneurysm which renders her very poor prognosis.            Consults  GI  Significant Tests:  See full reports for all details    Nm Gi Blood Loss  05/13/2015   CLINICAL DATA:  GI bleeding beginning 2 days ago  EXAM: NUCLEAR MEDICINE GASTROINTESTINAL BLEEDING SCAN  TECHNIQUE: Sequential abdominal images were obtained following intravenous  administration of Tc-40m labeled red blood cells.  RADIOPHARMACEUTICALS:  22.2 mCi Tc-73m in-vitro labeled autologous red cells.  COMPARISON:  None ; no cross-sectional imaging available for correlation.  FINDINGS: Initial normal blood pool distribution of tracer.  Larger rounded area of abnormal tracer localization identified in the upper mid abdomen slightly LEFT of midline, overlying the expected position of the aorta.  This collection remains unchanged throughout the 2 hours of imaging.  No progression of tracer from this site to suggest this is within bowel.  Finding is suspicious for an aortic aneurysm.  No abnormal GI localization of tracer identified throughout the exam to indicate active gastrointestinal bleeding.  IMPRESSION: No scintigraphic evidence of GI bleeding.  Abnormal collection of labeled erythrocytes in the upper mid abdomen LEFT of midline, persisting throughout exam, raising question of abdominal aortic aneurysm.  CT imaging recommended to assess.  Findings called to Dr. Posey Pronto on 05/13/2015 at 1558 hours.   Electronically Signed   By: Lavonia Dana M.D.   On: 05/13/2015 15:58   Dg Chest Port 1 View  05/12/2015   CLINICAL DATA:  Weakness and bright red bloody stools. History of breast cancer with mastectomy.  EXAM: PORTABLE CHEST - 1 VIEW  COMPARISON:  04/23/2015  FINDINGS: Borderline heart size without vascular congestion. Emphysematous changes in the lungs. Central interstitial changes suggest chronic bronchitis. No focal consolidation or airspace disease. No blunting of costophrenic angles. No pneumothorax. Surgical clips in the right axilla. Calcification of the aorta.  IMPRESSION: Emphysematous changes in the lungs with central chronic bronchitic change. No evidence of active pulmonary disease.   Electronically Signed  By: Lucienne Capers M.D.   On: 05/12/2015 03:40   Dg Chest Portable 1 View  04/23/2015   CLINICAL DATA:  81 year old female with a history of cough and weakness.  EXAM:  PORTABLE CHEST - 1 VIEW  COMPARISON:  02/01/2014, 11/22/2008  FINDINGS: Cardiomediastinal silhouette is unchanged. Atherosclerotic calcification of the aortic arch.  Diffuse coarsening of interstitial markings. No evidence of pulmonary vascular congestion. No pneumothorax. Left base not well evaluated secondary to low inspiratory volume.  Surgical clips of the right axilla.  No displaced fracture.  IMPRESSION: No evidence of lobar pneumonia, however, increased interstitial opacities may reflect atypical infection or bronchitis.  Atherosclerosis.  Surgical changes of the right axilla.  Signed,  Dulcy Fanny. Earleen Newport, DO  [facility]  [facility]   Electronically Signed   By: Corrie Mckusick D.O.   On: 04/23/2015 08:03   Ct Angio Abd/pel W/ And/or W/o  05/14/2015   CLINICAL DATA:  Rectal bleeding.  Concern for aorta colonic fistula  EXAM: CTA ABDOMEN AND PELVIS wITHOUT AND WITH CONTRAST  TECHNIQUE: Multidetector CT imaging of the abdomen and pelvis was performed using the standard protocol during bolus administration of intravenous contrast. Multiplanar reconstructed images and MIPs were obtained and reviewed to evaluate the vascular anatomy.  CONTRAST:  74mL OMNIPAQUE IOHEXOL 350 MG/ML SOLN  COMPARISON:  None.  FINDINGS: Minimal areas of atelectasis or scarring in the lung bases. There are several small peripheral nodules in the lung bases bilaterally. No effusions. Heart is upper limits normal in size. Densely calcified mitral valve. Coronary artery scratch head diffuse right coronary artery calcifications noted. Small hiatal hernia.  Prior cholecystectomy. Liver, spleen, pancreas unremarkable. Small nodules in the adrenal glands bilaterally, nonspecific, but most likely adenomas. 3.4 cm cyst in the midpole of the right kidney. Smaller cysts in the kidneys bilaterally. No hydronephrosis.  Large aortic aneurysm measuring up to 7.2 cm. Moderate to large amount of mural plaque.  The aneurysm begins just below the renal artery and hands at the aortic bifurcation. Severe atherosclerotic disease in the iliac vessels with moderate to high-grade stenosis in the proximal right external iliac artery. Atherosclerotic calcifications at the origins of and in the proximal celiac artery and superior mesenteric artery. Inferior mesenteric artery not visualize, likely occluded left renal artery is severely calcified.  Extensive descending colonic and sigmoid diverticulosis. No active diverticulitis. Stomach and small bowel are unremarkable. With no evidence of aortoenteric fistula.  Uterus, adnexae and urinary bladder are unremarkable. Small bilateral inguinal hernias containing fat.  Review of the MIP images confirms the above findings.  IMPRESSION: 7.2 cm infrarenal abdominal aortic aneurysm with moderate mural plaque. No evidence of aortoenteric fistula.  Tight stenosis in the proximal right external iliac artery. Heavily calcified left renal artery, suspect hemodynamically significant stenosis.  Celiac artery and superior mesenteric artery are patent with moderate to advanced atherosclerotic calcifications. 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  Diffuse colonic diverticulosis, most pronounced in the sigmoid colon. No evidence of active diverticulitis.  Small peripheral bilateral lower lobe pulmonary nodules, nonspecific.   Electronically Signed   By: Rolm Baptise M.D.   On: 05/14/2015 10:19       Today   Subjective:   Lisa Fitzgerald  continues to have intermittent chest pain and shortness of breath. No further bleeding according to her  Objective:   Blood pressure 110/60, pulse 90, temperature 97.9 F (36.6 C), temperature source Oral, resp. rate 18, height 5\' 5"  (1.651 m), weight 85.548 kg (188  lb 9.6 oz), SpO2 98 %.  .  Intake/Output Summary (Last 24 hours) at 05/18/15 1300 Last data filed at 05/18/15 0900  Gross per 24 hour  Intake   1200 ml  Output      0 ml  Net   1200 ml     Exam VITAL SIGNS: Blood pressure 110/60, pulse 90, temperature 97.9 F (36.6 C), temperature source Oral, resp. rate 18, height 5\' 5"  (1.651 m), weight 85.548 kg (188 lb 9.6 oz), SpO2 98 %.  GENERAL:  81 y.o.-year-old patient lying in the bed with no acute distress.  EYES: Pupils equal, round, reactive to light and accommodation. No scleral icterus. Extraocular muscles intact.  HEENT: Head atraumatic, normocephalic. Oropharynx and nasopharynx clear.  NECK:  Supple, no jugular venous distention. No thyroid enlargement, no tenderness.  LUNGS: Normal breath sounds bilaterally, no wheezing, rales,rhonchi or crepitation. No use of accessory muscles of respiration.  CARDIOVASCULAR: S1, S2 normal. No murmurs, rubs, or gallops.  ABDOMEN: Soft, nontender, nondistended. Bowel sounds present. No organomegaly or mass.  EXTREMITIES: No pedal edema, cyanosis, or clubbing.  NEUROLOGIC: Cranial nerves II through XII are intact. Muscle strength 5/5 in all extremities. Sensation intact. Gait not checked.  PSYCHIATRIC: The patient is alert and oriented x 3.  SKIN: No obvious rash, lesion, or ulcer.   Data Review     CBC w Diff: Lab Results  Component Value Date   WBC 11.2* 05/17/2015   WBC 8.5 02/01/2014   HGB 6.5* 05/17/2015   HGB 13.8 02/01/2014   HCT 20.0* 05/17/2015   HCT 42.2 02/01/2014   PLT 110* 05/17/2015   PLT 235 02/01/2014   LYMPHOPCT 10 05/15/2015   LYMPHOPCT 18.1 02/01/2014   MONOPCT 9 05/15/2015   MONOPCT 11.8 02/01/2014   EOSPCT 0 05/15/2015   EOSPCT 2.3 02/01/2014   BASOPCT 1 05/15/2015   BASOPCT 0.5 02/01/2014   CMP: Lab Results  Component Value Date   NA 140 05/16/2015   NA 137 02/01/2014   K 4.4 05/16/2015   K 3.9 02/01/2014   CL 118* 05/16/2015   CL 104 02/01/2014   CO2 20* 05/16/2015   CO2 28 02/01/2014   BUN 43* 05/16/2015   BUN 28* 02/01/2014   CREATININE 1.46* 05/16/2015   CREATININE 1.47* 02/01/2014   PROT 5.7* 05/12/2015   PROT 6.8 02/01/2014    ALBUMIN 3.1* 05/12/2015   ALBUMIN 3.4 02/01/2014   BILITOT 0.5 05/12/2015   BILITOT 0.4 02/01/2014   ALKPHOS 59 05/12/2015   ALKPHOS 68 02/01/2014   AST 26 05/12/2015   AST 24 02/01/2014   ALT 11* 05/12/2015   ALT 13 02/01/2014  .  Micro Results Recent Results (from the past 240 hour(s))  MRSA PCR Screening     Status: None   Collection Time: 05/12/15  6:25 AM  Result Value Ref Range Status   MRSA by PCR NEGATIVE NEGATIVE Final    Comment:        The GeneXpert MRSA Assay (FDA approved for NASAL specimens only), is one component of a comprehensive MRSA colonization surveillance program. It is not intended to diagnose MRSA infection nor to guide or monitor treatment for MRSA infections.         Code Status Orders        Start     Ordered   05/18/15 1103  Do not attempt resuscitation (DNR)   Continuous    Question Answer Comment  In the event of cardiac or respiratory ARREST Do not call a "  code blue"   In the event of cardiac or respiratory ARREST Do not perform Intubation, CPR, defibrillation or ACLS   In the event of cardiac or respiratory ARREST Use medication by any route, position, wound care, and other measures to relive pain and suffering. May use oxygen, suction and manual treatment of airway obstruction as needed for comfort.      05/18/15 1104    Advance Directive Documentation        Most Recent Value   Type of Advance Directive  Healthcare Power of Attorney, Living will   Pre-existing out of facility DNR order (yellow form or pink MOST form)     "MOST" Form in Place?              Discharge Medications     Medication List    STOP taking these medications        aspirin EC 81 MG tablet     budesonide-formoterol 160-4.5 MCG/ACT inhaler  Commonly known as:  SYMBICORT     magnesium oxide 400 MG tablet  Commonly known as:  MAG-OX     metoprolol tartrate 25 MG tablet  Commonly known as:  LOPRESSOR     olmesartan-hydrochlorothiazide  20-12.5 MG per tablet  Commonly known as:  BENICAR HCT      TAKE these medications        levothyroxine 50 MCG tablet  Commonly known as:  SYNTHROID, LEVOTHROID  Take 1 tablet (50 mcg total) by mouth daily before breakfast.     LORazepam 0.5 MG tablet  Commonly known as:  ATIVAN  Take 1 tablet (0.5 mg total) by mouth every 6 (six) hours as needed for anxiety.     morphine CONCENTRATE 10 mg / 0.5 ml concentrated solution  Take 0.25 mLs (5 mg total) by mouth every 12 (twelve) hours.     morphine CONCENTRATE 10 mg / 0.5 ml concentrated solution  Take 0.25 mLs (5 mg total) by mouth every 2 (two) hours as needed for severe pain or shortness of breath.     nitroGLYCERIN 0.4 MG SL tablet  Commonly known as:  NITROSTAT  Place 1 tablet (0.4 mg total) under the tongue every 5 (five) minutes as needed for chest pain.     polyethylene glycol packet  Commonly known as:  MIRALAX / GLYCOLAX  Take 17 g by mouth daily as needed for mild constipation.     prochlorperazine 25 MG suppository  Commonly known as:  COMPAZINE  Place 1 suppository (25 mg total) rectally every 12 (twelve) hours as needed for nausea or vomiting.     senna 8.6 MG Tabs tablet  Commonly known as:  SENOKOT  Take 2 tablets (17.2 mg total) by mouth at bedtime as needed for moderate constipation.     sodium chloride 0.65 % Soln nasal spray  Commonly known as:  OCEAN  Place 1 spray into both nostrils every 2 (two) hours as needed for congestion.     triamcinolone 55 MCG/ACT Aero nasal inhaler  Commonly known as:  NASACORT ALLERGY 24HR  Place 2 sprays into the nose daily. As needed           Total Time in preparing paper work, data evaluation and todays exam - 35 minutes  Dustin Flock M.D on 05/18/2015 at 1:00 [facility]  [place]  [phone]

## 2015-05-18 NOTE — Progress Notes (Signed)
CSW was contacted by Pt's family and they have chosen [place] with [facility] for Pt's discharge today. CSW offered choice of hospice services at SNF. Pt will be transported via ems. RN to call report and EMS for transport. Family will come back to hospital after completing paperwork at [facility].    No further CSW needs at this time.   Toma Copier, [place]

## 2015-05-18 NOTE — Progress Notes (Signed)
New referral for [facility] services at [facility] after discharge received from [place] following several meetings with palliative Care physician Dr. Megan Salon. Lisa Fitzgerald is a 81 year old woman who presented to the ED via EMS on 8/26 from her ALF for evaluation of rectal bleeding. She continued with bloody stools and rectal bleeding, she required 4 units of PRBC's during this hospitalization, her last hemoglobin on 8/31 was 6.5. She has declined any further transfusions. Per chart notes a CT performed on 8/27 revealed a large infrarenal abdominal aortic aneurysm. PMH: includes: breast Ca, HTN, COPD,hypothyroidism and CAD. Patient and family have chosen  at this time to focus on her comfort. She had become less mobile prior to this admission and they feel she is not longer appropriate for return to ALF. Family visited several facilities that had made bed offers an have chosen [facility]. Patient will be private pay. Writer met with patient and her daughter Lisa Fitzgerald, son and grandson Lisa Fitzgerald in the room. [name] was alert and oriented to her family and surroundings.  Education initiated regarding hospice services, team approach and philosophy of care with good understanding voiced by all. Daughter Lisa Fitzgerald to be contact for admission [phone] c) or [phone] h. Plan is for discharge today via EMS. Updated information faxed to referral intake. Thank you for the opportunity to be involved in the care of Mrs. Agent. Flo Shanks RN, BSN, [place] and [facility] [place], [facility] [phone] c

## 2015-05-18 NOTE — Discharge Instructions (Signed)
°  DIET:  Regular diet  DISCHARGE CONDITION:  Good  ACTIVITY:  Activity as tolerated  OXYGEN:  Home Oxygen: yes    Oxygen Delivery: 2 liters/min via Patient connected to nasal cannula oxygen  DISCHARGE LOCATION:  nursing home    ADDITIONAL DISCHARGE INSTRUCTION: pt doesn't want to return to hospital   If you experience worsening of your admission symptoms, develop shortness of breath, life threatening emergency, suicidal or homicidal thoughts you must seek medical attention immediately by calling 911 or calling your MD immediately  if symptoms less severe.  You Must read complete instructions/literature along with all the possible adverse reactions/side effects for all the Medicines you take and that have been prescribed to you. Take any new Medicines after you have completely understood and accpet all the possible adverse reactions/side effects.   Please note  You were cared for by a hospitalist during your hospital stay. If you have any questions about your discharge medications or the care you received while you were in the hospital after you are discharged, you can call the unit and asked to speak with the hospitalist on call if the hospitalist that took care of you is not available. Once you are discharged, your primary care physician will handle any further medical issues. Please note that NO REFILLS for any discharge medications will be authorized once you are discharged, as it is imperative that you return to your primary care physician (or establish a relationship with a primary care physician if you do not have one) for your aftercare needs so that they can reassess your need for medications and monitor your lab values.

## 2015-05-18 NOTE — Clinical Social Work Note (Signed)
CSW extended bed offers to pt and family.  Pt has offers at [facility], [facility], [place], [place] and [facility].  CSW asked the family to choose a facility by 3 pm.    Pt's grandson, Dola Factor [phone]) explained that they would like to visit each facility and may not be able to make a decision by today.  CSW explained that there is a possibility that pt will be billed at 100% if family does not select a facility today.  Lead CSW has been informed and will follow up with family this afternoon.  [place], [place]

## 2015-05-19 ENCOUNTER — Other Ambulatory Visit: Payer: Self-pay

## 2015-05-19 ENCOUNTER — Non-Acute Institutional Stay (SKILLED_NURSING_FACILITY): Payer: Medicare Other | Admitting: Internal Medicine

## 2015-05-19 DIAGNOSIS — F411 Generalized anxiety disorder: Secondary | ICD-10-CM

## 2015-05-19 DIAGNOSIS — R5381 Other malaise: Secondary | ICD-10-CM | POA: Diagnosis not present

## 2015-05-19 DIAGNOSIS — J449 Chronic obstructive pulmonary disease, unspecified: Secondary | ICD-10-CM | POA: Diagnosis not present

## 2015-05-19 DIAGNOSIS — D62 Acute posthemorrhagic anemia: Secondary | ICD-10-CM | POA: Diagnosis not present

## 2015-05-19 DIAGNOSIS — I714 Abdominal aortic aneurysm, without rupture, unspecified: Secondary | ICD-10-CM

## 2015-05-19 DIAGNOSIS — I200 Unstable angina: Secondary | ICD-10-CM | POA: Diagnosis not present

## 2015-05-19 DIAGNOSIS — K922 Gastrointestinal hemorrhage, unspecified: Secondary | ICD-10-CM

## 2015-05-19 DIAGNOSIS — E039 Hypothyroidism, unspecified: Secondary | ICD-10-CM

## 2015-05-19 DIAGNOSIS — K5900 Constipation, unspecified: Secondary | ICD-10-CM

## 2015-05-19 MED ORDER — MORPHINE SULFATE (CONCENTRATE) 20 MG/ML PO SOLN: ORAL | Status: AC

## 2015-05-19 NOTE — Telephone Encounter (Signed)
Rx faxed to [facility] @ [phone], phone number [phone]  

## 2015-05-19 NOTE — Progress Notes (Signed)
Patient ID: Lisa Fitzgerald, female   DOB: 1917-12-18, 81 y.o.   MRN: [id number]     Facility: [facility]     PCP: No primary care provider on file.  Code Status: DNR  Allergies  Allergen Reactions  . Penicillins     Chief Complaint  Patient presents with  . New Admit To SNF     HPI:  81 y.o. patient is here for long term care post hospital admission from 05/12/15-05/18/15 with lower gi bleed and hypotension. She was initially given several untis of PRBC transfusion and was having imaging test but patient and family decided to opt for hospice care and wanted no further blood transfusion and aggressive treatment. She is now under hospice service and transferred to our facility under hospice service. She was residing in assisted living facility prior to this. She has PMH of abdominal aortic aneurysm, unstable angina with CAD, hypothyroidism, COPD. She is seen in her room today. She is alert and oriented to person and place but appears to have cognitive deficit at times during conversation. She has trouble breathing and feels that oxygen is helping her. She feels weak and tired but has no other concerns. She feels comfortable. She is currently on liquid diet and would like to be changed to regular diet  Review of Systems:  Constitutional: Negative for fever, chills, diaphoresis.  HENT: Negative for headache, congestion, nasal discharge Eyes: Negative for eye pain, blurred vision, double vision and discharge.  Respiratory: Negative for cough and wheezing.   Cardiovascular: Negative for chest pain, palpitations, leg swelling.  Gastrointestinal: Negative for heartburn, nausea, vomiting, abdominal pain Genitourinary: Negative for dysuria and flank pain.  Musculoskeletal: Negative for back pain Skin: Negative for itching.  Neurological: positive for weakness Psychiatric/Behavioral: needs redirection frequently    Past Medical History  Diagnosis Date  .  Hypertension   . COPD (chronic obstructive pulmonary disease)   . Coronary artery disease   . Thyroid disease   . Cancer    Past Surgical History  Procedure Laterality Date  . Breast surgery    . Cholecystectomy    . Mastectomy    . Mastectomy      30 years ago  . Hemorrhoid surgery     Social History:   reports that she has quit smoking. She does not have any smokeless tobacco history on file. She reports that she does not drink alcohol or use illicit drugs.  Family History  Problem Relation Age of Onset  . Stroke Mother   . Stroke Father     Medications:   Medication List       This list is accurate as of: 05/19/15  4:33 PM.  Always use your most recent med list.               levothyroxine 50 MCG tablet  Commonly known as:  SYNTHROID, LEVOTHROID  Take 1 tablet (50 mcg total) by mouth daily before breakfast.     LORazepam 0.5 MG tablet  Commonly known as:  ATIVAN  Take 1 tablet (0.5 mg total) by mouth every 6 (six) hours as needed for anxiety.     morphine CONCENTRATE 10 mg / 0.5 ml concentrated solution  Take 0.25 mLs (5 mg total) by mouth every 12 (twelve) hours.     morphine CONCENTRATE 10 mg / 0.5 ml concentrated solution  Take 0.25 mLs (5 mg total) by mouth every 2 (two) hours as needed for severe pain or shortness of breath.  morphine 20 MG/ML concentrated solution  Commonly known as:  ROXANOL  Administer 0.25 mL = 5 mg by mouth every 12 hours DOUBLE CHECK DOSE USE CALIBRATED SYRINGE EVERY 12 HOURS AS NEEDED     nitroGLYCERIN 0.4 MG SL tablet  Commonly known as:  NITROSTAT  Place 1 tablet (0.4 mg total) under the tongue every 5 (five) minutes as needed for chest pain.     polyethylene glycol packet  Commonly known as:  MIRALAX / GLYCOLAX  Take 17 g by mouth daily as needed for mild constipation.     prochlorperazine 25 MG suppository  Commonly known as:  COMPAZINE  Place 1 suppository (25 mg total) rectally every 12 (twelve) hours as needed for  nausea or vomiting.     senna 8.6 MG Tabs tablet  Commonly known as:  SENOKOT  Take 2 tablets (17.2 mg total) by mouth at bedtime as needed for moderate constipation.     sodium chloride 0.65 % Soln nasal spray  Commonly known as:  OCEAN  Place 1 spray into both nostrils every 2 (two) hours as needed for congestion.     triamcinolone 55 MCG/ACT Aero nasal inhaler  Commonly known as:  NASACORT ALLERGY 24HR  Place 2 sprays into the nose daily. As needed         Physical Exam: Filed Vitals:   05/19/15 1630  BP: 112/60  Pulse: 88  Temp: 97.8 F (36.6 C)  Resp: 18  SpO2: 95%    General- elderly female, frail and ill appearing, in no acute distress Head- normocephalic, atraumatic Nose- normal nasal mucosa, o2 by nasal canula Throat- dry mucus membrane Eyes- no pallor, no icterus, no discharge, normal conjunctiva, normal sclera Neck- no cervical lymphadenopathy Cardiovascular- normal A2,N0, systolic murmur present, trace leg edema Respiratory- bilateral poor air entry, no use of accessory muscles Abdomen- bowel sounds present, soft, non tender Musculoskeletal- able to move all 4 extremities, generalized weakness, arm edema with easy bruising  Neurological- no focal deficit, alert and oriented to person Skin- warm and dry   Labs reviewed: Basic Metabolic Panel:  Recent Labs  05/14/15 0202 05/15/15 0908 05/16/15 0446  NA 139 142 140  K 4.6 4.8 4.4  CL 110 116* 118*  CO2 23 21* 20*  GLUCOSE 134* 145* 137*  BUN 33* 41* 43*  CREATININE 1.25* 1.68* 1.46*  CALCIUM 8.2* 8.0* 7.6*   Liver Function Tests:  Recent Labs  04/23/15 0809 05/12/15 0159  AST 23 26  ALT 13* 11*  ALKPHOS 74 59  BILITOT 0.8 0.5  PROT 7.2 5.7*  ALBUMIN 4.3 3.1*   No results for input(s): LIPASE, AMYLASE in the last 8760 hours. No results for input(s): AMMONIA in the last 8760 hours. CBC:  Recent Labs  05/12/15 0159  05/15/15 0908 05/16/15 0446 05/17/15 0507  WBC 8.3  < > 19.0*  17.4* 11.2*  NEUTROABS 5.8  --  15.2*  --   --   HGB 11.2*  < > 9.8* 6.8* 6.5*  HCT 33.4*  < > 29.8* 20.7* 20.0*  MCV 94.7  < > 92.2 94.4 96.0  PLT 204  < > 117* 106* 110*  < > = values in this interval not displayed. Cardiac Enzymes:  Recent Labs  04/23/15 0809  TROPONINI <0.03   BNP: Invalid input(s): POCBNP CBG: No results for input(s): GLUCAP in the last 8760 hours.  Radiological Exams: Nm Gi Blood Loss  05/13/2015   CLINICAL DATA:  GI bleeding beginning 2 days ago  EXAM: NUCLEAR MEDICINE GASTROINTESTINAL BLEEDING SCAN  TECHNIQUE: Sequential abdominal images were obtained following intravenous administration of Tc-81m labeled red blood cells.  RADIOPHARMACEUTICALS:  22.2 mCi Tc-18m in-vitro labeled autologous red cells.  COMPARISON:  None ; no cross-sectional imaging available for correlation.  FINDINGS: Initial normal blood pool distribution of tracer.  Larger rounded area of abnormal tracer localization identified in the upper mid abdomen slightly LEFT of midline, overlying the expected position of the aorta.  This collection remains unchanged throughout the 2 hours of imaging.  No progression of tracer from this site to suggest this is within bowel.  Finding is suspicious for an aortic aneurysm.  No abnormal GI localization of tracer identified throughout the exam to indicate active gastrointestinal bleeding.  IMPRESSION: No scintigraphic evidence of GI bleeding.  Abnormal collection of labeled erythrocytes in the upper mid abdomen LEFT of midline, persisting throughout exam, raising question of abdominal aortic aneurysm.  CT imaging recommended to assess.  Findings called to Dr. Posey Pronto on 05/13/2015 at 1558 hours.   Electronically Signed   By: Lavonia Dana M.D.   On: 05/13/2015 15:58   Dg Chest Port 1 View  05/12/2015   CLINICAL DATA:  Weakness and bright red bloody stools. History of breast cancer with mastectomy.  EXAM: PORTABLE CHEST - 1 VIEW  COMPARISON:  04/23/2015  FINDINGS:  Borderline heart size without vascular congestion. Emphysematous changes in the lungs. Central interstitial changes suggest chronic bronchitis. No focal consolidation or airspace disease. No blunting of costophrenic angles. No pneumothorax. Surgical clips in the right axilla. Calcification of the aorta.  IMPRESSION: Emphysematous changes in the lungs with central chronic bronchitic change. No evidence of active pulmonary disease.   Electronically Signed   By: Lucienne Capers M.D.   On: 05/12/2015 03:40    Assessment/Plan  Physical deconditioning Here under hospice services for comfort care. Assistance with ADLs, pressure ulcer prophylaxis. With comfort being the main goal, change diet to regular diet per resident request  Gi bleed No definite source identified, no further intervention desired by patient, monitor clinically and provide symptomatic treatment  Blood loss anemia Likely ongoing and will continue to decline. Monitor clinically, roxanol prn for dyspnea  Copd On o2 and breathing currently stable  Hypothyroidism Continue levothyroxine 50 mcg daily  Anxiety Continue ativan 0.5 mg q6h prn anxiety  Unstable angina Remains chest pain free at present, continue prn NTG and roxanol for pain  Constipation Continue senna s and miralax current regimen, no changes made  Abdominal aortic aneurysm Denies abdominal pain, monitor clinically  Goals of care: long term care   Labs/tests ordered: none  Family/ staff Communication: reviewed care plan with patient and nursing supervisor    Blanchie Serve, MD  [facility] [phone] (Monday-Friday 8 am - 5 pm) [phone] (afterhours)

## 2015-05-26 DIAGNOSIS — K922 Gastrointestinal hemorrhage, unspecified: Secondary | ICD-10-CM | POA: Diagnosis not present

## 2015-05-26 DIAGNOSIS — D62 Acute posthemorrhagic anemia: Secondary | ICD-10-CM | POA: Diagnosis not present

## 2015-05-26 DIAGNOSIS — I959 Hypotension, unspecified: Secondary | ICD-10-CM | POA: Diagnosis not present

## 2015-05-26 DIAGNOSIS — I714 Abdominal aortic aneurysm, without rupture: Secondary | ICD-10-CM | POA: Diagnosis not present

## 2015-05-29 ENCOUNTER — Other Ambulatory Visit: Payer: Self-pay | Admitting: *Deleted

## 2015-05-29 MED ORDER — MORPHINE SULFATE (CONCENTRATE) 10 MG /0.5 ML PO SOLN: ORAL | Status: AC

## 2015-05-29 NOTE — Telephone Encounter (Signed)
[facility] 

## 2015-06-17 DEATH — deceased

## 2016-09-18 IMAGING — CT CT CTA ABD/PEL W/CM AND/OR W/O CM
2 of 3 series · 13 of 32 positions shown, 18 images · IV contrast (APPLIED)
Comparison: None.

CLINICAL DATA: Rectal bleeding.  Concern for aorta colonic fistula

EXAM:
CTA ABDOMEN AND PELVIS wITHOUT AND WITH CONTRAST
TECHNIQUE: Multidetector CT imaging of the abdomen and pelvis was performed
using the standard protocol during bolus administration of
intravenous contrast. Multiplanar reconstructed images and MIPs were
obtained and reviewed to evaluate the vascular anatomy.
CONTRAST:  80mL OMNIPAQUE IOHEXOL 350 MG/ML SOLN

[Series 4: axial venous · axial · portal-venous · 0.86mm/px · z∈[-980,-700]mm · 6 of 80 slices shown, 11 images]
[im 12/80  soft-tissue]
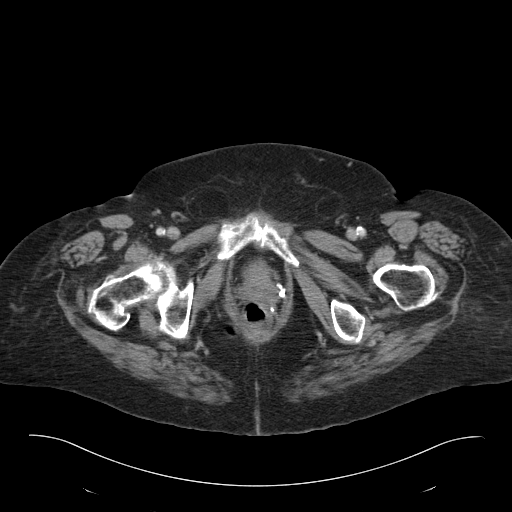
[im 12/80  bone]
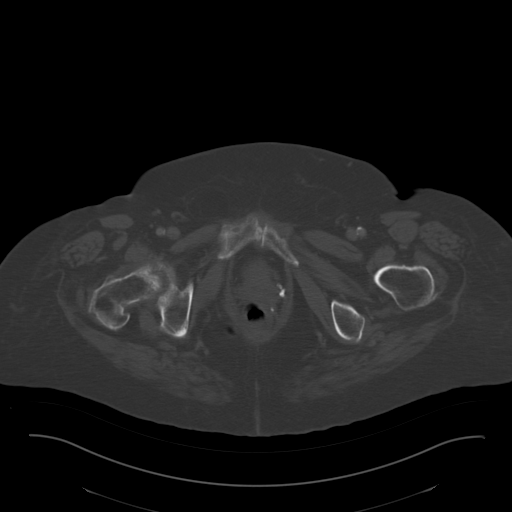
[im 23/80  soft-tissue]
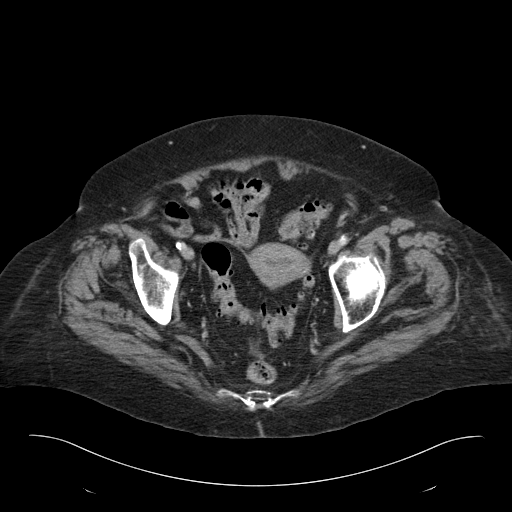
[im 34/80  soft-tissue]
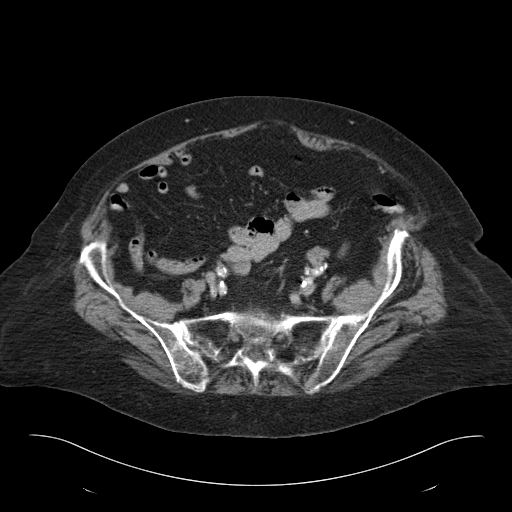
[im 34/80  lung]
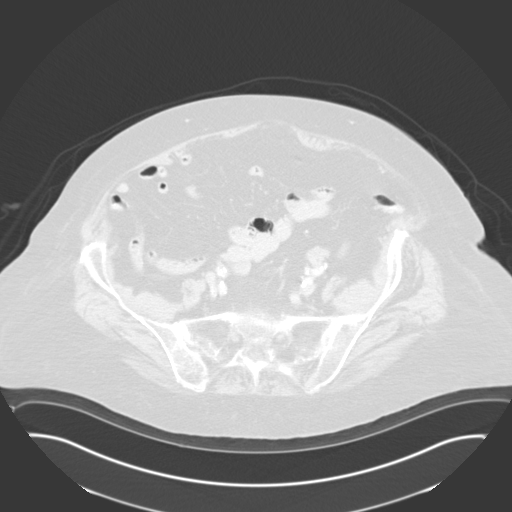
[im 46/80  soft-tissue]
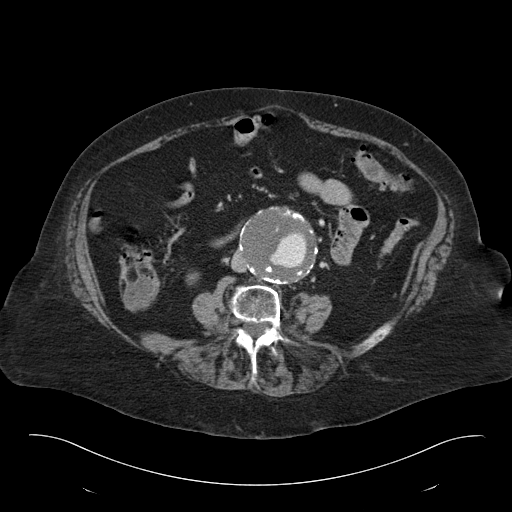
[im 46/80  lung]
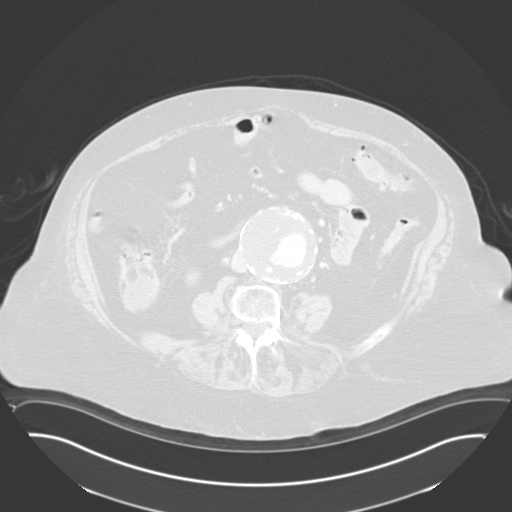
[im 57/80  soft-tissue]
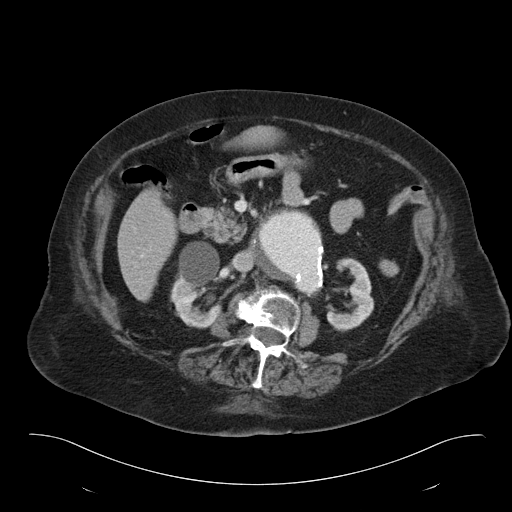
[im 57/80  lung]
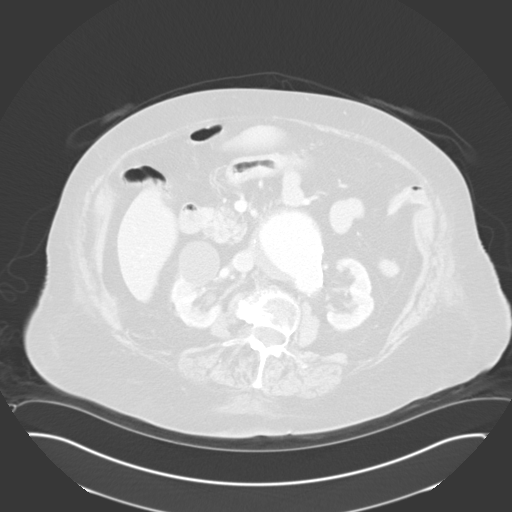
[im 68/80  soft-tissue]
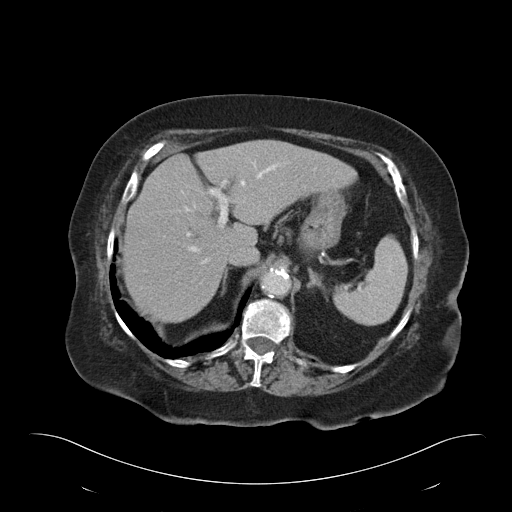
[im 68/80  lung]
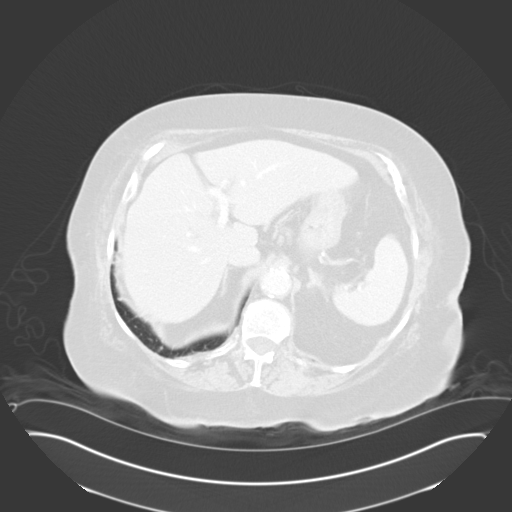

[Series 5: axial arterial · axial · arterial · 0.86mm/px · z∈[-1002,-716]mm · 7 of 200 slices shown]
[im 19/200  soft-tissue]
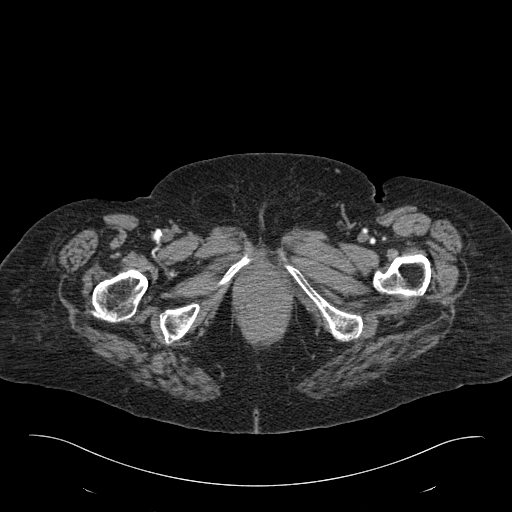
[im 38/200  soft-tissue]
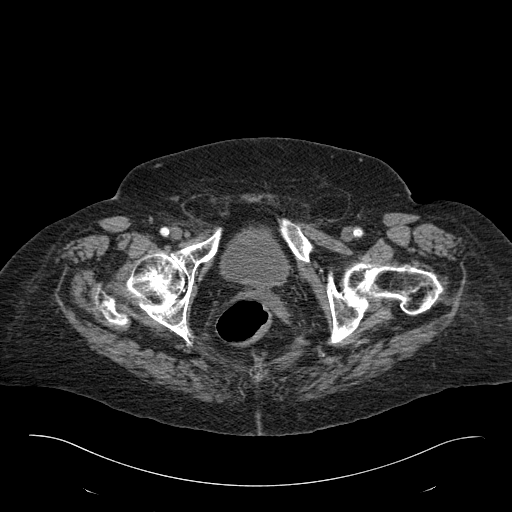
[im 67/200  soft-tissue]
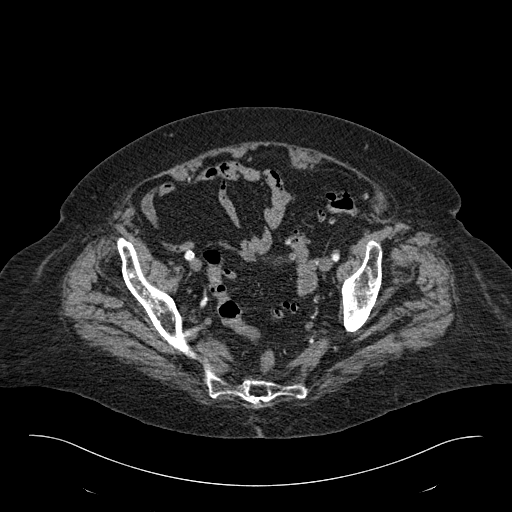
[im 86/200  soft-tissue]
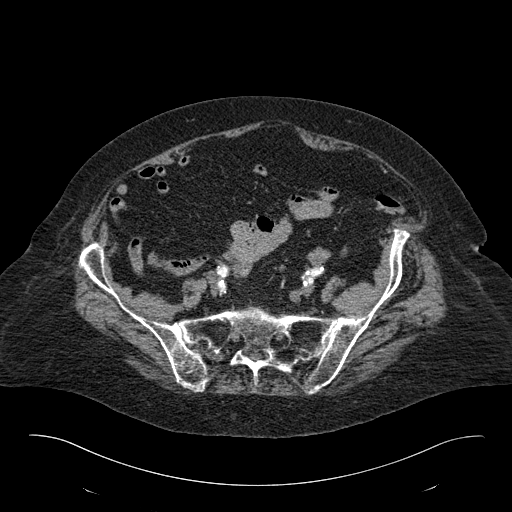
[im 114/200  soft-tissue]
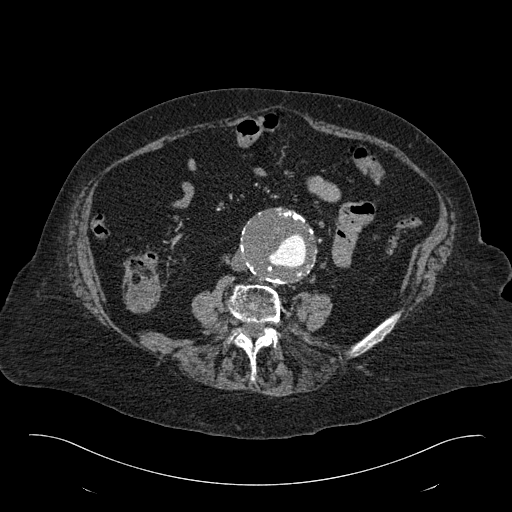
[im 133/200  soft-tissue]
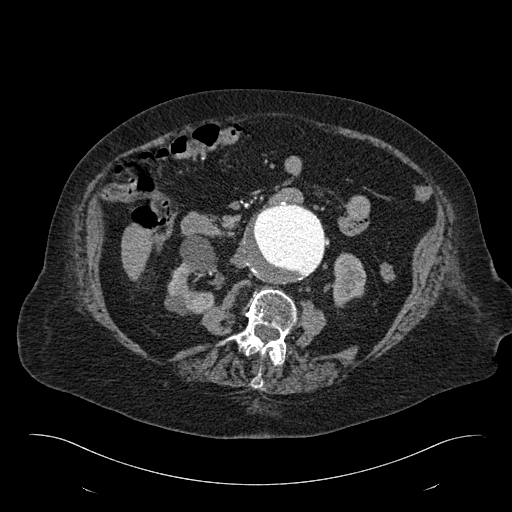
[im 162/200  soft-tissue]
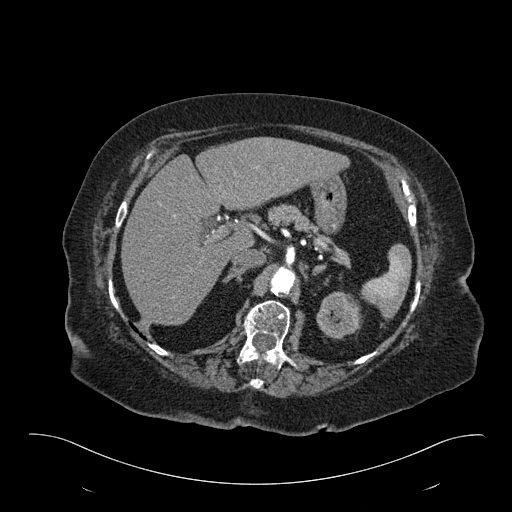

[13 of 32 positions shown; findings below may reference images not displayed]

FINDINGS: Minimal areas of atelectasis or scarring in the lung bases. There
are several small peripheral nodules in the lung bases bilaterally.
No effusions. Heart is upper limits normal in size. Densely
calcified mitral valve. Coronary artery scratch head diffuse right
coronary artery calcifications noted. Small hiatal hernia.

Prior cholecystectomy. Liver, spleen, pancreas unremarkable. Small
nodules in the adrenal glands bilaterally, nonspecific, but most
likely adenomas. 3.4 cm cyst in the midpole of the right kidney.
Smaller cysts in the kidneys bilaterally. No hydronephrosis.

Large aortic aneurysm measuring up to 7.2 cm. Moderate to large
amount of mural plaque. The aneurysm begins just below the renal
artery and hands at the aortic bifurcation. Severe atherosclerotic
disease in the iliac vessels with moderate to high-grade stenosis in
the proximal right external iliac artery. Atherosclerotic
calcifications at the origins of and in the proximal celiac artery
and superior mesenteric artery. Inferior mesenteric artery not
visualize, likely occluded left renal artery is severely calcified.

Extensive descending colonic and sigmoid diverticulosis. No active
diverticulitis. Stomach and small bowel are unremarkable. With no
evidence of aortoenteric fistula.

Uterus, adnexae and urinary bladder are unremarkable. Small
bilateral inguinal hernias containing fat.

Review of the MIP images confirms the above findings.
IMPRESSION: 7.2 cm infrarenal abdominal aortic aneurysm with moderate mural
plaque. No evidence of aortoenteric fistula.

Tight stenosis in the proximal right external iliac artery. Heavily
calcified left renal artery, suspect hemodynamically significant
stenosis.

Celiac artery and superior mesenteric artery are patent with
moderate to advanced atherosclerotic calcifications. Inferior
mesenteric artery likely occluded.

Diffuse colonic diverticulosis, most pronounced in the sigmoid
colon. No evidence of active diverticulitis.

Small peripheral bilateral lower lobe pulmonary nodules,
nonspecific.
